# Patient Record
Sex: Female | Born: 1955 | Race: White | Hispanic: No | Marital: Married | Smoking: Former smoker
Health system: Southern US, Community
[De-identification: ages and names within clinical notes are randomized; demographics above are authoritative.]

## PROBLEM LIST (undated history)

## (undated) DIAGNOSIS — D649 Anemia, unspecified: Secondary | ICD-10-CM

## (undated) DIAGNOSIS — J45909 Unspecified asthma, uncomplicated: Secondary | ICD-10-CM

## (undated) DIAGNOSIS — F419 Anxiety disorder, unspecified: Secondary | ICD-10-CM

## (undated) DIAGNOSIS — G43909 Migraine, unspecified, not intractable, without status migrainosus: Secondary | ICD-10-CM

## (undated) DIAGNOSIS — D219 Benign neoplasm of connective and other soft tissue, unspecified: Secondary | ICD-10-CM

## (undated) DIAGNOSIS — R519 Headache, unspecified: Secondary | ICD-10-CM

## (undated) DIAGNOSIS — N649 Disorder of breast, unspecified: Secondary | ICD-10-CM

## (undated) DIAGNOSIS — IMO0002 Reserved for concepts with insufficient information to code with codable children: Secondary | ICD-10-CM

## (undated) HISTORY — PX: HX CYST REMOVAL: SHX22

## (undated) HISTORY — DX: Headache, unspecified: R51.9

## (undated) HISTORY — DX: Disorder of breast, unspecified: N64.9

## (undated) HISTORY — DX: Reserved for concepts with insufficient information to code with codable children: IMO0002

## (undated) HISTORY — PX: HX WISDOM TEETH EXTRACTION: SHX21

## (undated) HISTORY — PX: HX OTHER: 2100001105

## (undated) HISTORY — PX: HX HYSTERECTOMY: SHX81

## (undated) HISTORY — PX: HX BREAST BIOPSY: SHX20

## (undated) HISTORY — DX: Migraine, unspecified, not intractable, without status migrainosus: G43.909

## (undated) HISTORY — DX: Benign neoplasm of connective and other soft tissue, unspecified: D21.9

## (undated) HISTORY — DX: Anemia, unspecified: D64.9

## (undated) HISTORY — DX: Unspecified asthma, uncomplicated: J45.909

## (undated) HISTORY — PX: APPENDECTOMY: SHX54

## (undated) HISTORY — PX: BREAST EXCISIONAL BIOPSY: SUR124

## (undated) HISTORY — DX: Anxiety disorder, unspecified: F41.9

---

## 1988-03-10 HISTORY — PX: ABDOMINAL HYSTERECTOMY: SHX81

## 1992-03-10 HISTORY — PX: OOPHORECTOMY: SHX86

## 1993-03-10 HISTORY — PX: BREAST BIOPSY: SHX20

## 2008-11-15 ENCOUNTER — Ambulatory Visit (HOSPITAL_COMMUNITY): Payer: Self-pay | Admitting: Plastic and Reconstructive Surgery

## 2013-11-01 ENCOUNTER — Ambulatory Visit (INDEPENDENT_AMBULATORY_CARE_PROVIDER_SITE_OTHER): Payer: Managed Care, Other (non HMO) | Admitting: Obstetrics & Gynecology

## 2013-11-01 ENCOUNTER — Encounter (INDEPENDENT_AMBULATORY_CARE_PROVIDER_SITE_OTHER): Payer: Self-pay | Admitting: Obstetrics & Gynecology

## 2013-11-01 VITALS — BP 130/82 | Ht 63.0 in | Wt 126.0 lb

## 2013-11-01 DIAGNOSIS — Z1231 Encounter for screening mammogram for malignant neoplasm of breast: Secondary | ICD-10-CM

## 2013-11-01 DIAGNOSIS — Z01419 Encounter for gynecological examination (general) (routine) without abnormal findings: Secondary | ICD-10-CM

## 2013-11-01 NOTE — Progress Notes (Signed)
SUBJECTIVE:   58 y.o. female for annual routine Pap smear and checkup.  Current Outpatient Prescriptions   Medication Sig Dispense Refill    amitriptyline (ELAVIL) 100 mg Oral Tablet Take 100 mg by mouth Every night      atorvastatin (LIPITOR) 10 mg Oral Tablet Take 10 mg by mouth Once a day      CALCIUM CARBONATE/VITAMIN D3 (VITAMIN D-3 ORAL) Take by mouth      citalopram (CELEXA) 40 mg Oral Tablet Take 40 mg by mouth Once a day      cyanocobalamin (VITAMIN B 12) 1,000 mcg Oral Tablet Take 1,000 mcg by mouth Once a day       No current facility-administered medications for this visit.     Allergies: Review of patient's allergies indicates no known allergies.   No LMP recorded. Patient has had a hysterectomy.    ROS:  Feeling well. No dyspnea or chest pain on exertion.  No abdominal pain, change in bowel habits, black or bloody stools.  No urinary tract symptoms. GYN ROS: no breast pain or new or enlarging lumps on self exam, no vaginal bleeding, no hot flashes.    OBJECTIVE:   The patient appears well, in NAD.   BP 130/82 mmHg   Ht 1.6 m ( )   Wt 57.153 kg (126 lb)   BMI 22.33 kg/m2  ENT normal.  Neck supple. No adenopathy or thyromegaly. PERLA. Lungs are clear, good air entry, no wheezes, rhonchi or rales. S1 and S2 normal, no murmurs, regular rate and rhythm. No edema. Abdomen soft without tenderness, guarding, mass or organomegaly.    BREAST EXAM: normal without suspicious masses, skin or nipple changes or axillary nodes, self-exam is taught and encouraged    PELVIC EXAM: exam chaperoned by nurse, EGBUS within normal limits, normal vagina and vulva, atrophic vaginal changes noted, adnexa normal in size without mass or tenderness, uterus and adnexa surgically absent, no mass or tenderness, rectal exam, no lesions, normal sphincter tone, no masses, negative guaiac, pap smear done today    ASSESSMENT:   well woman  postmenopause .    PLAN:   mammogram  pap smear  counseled on breast self exam, mammography  screening, menopause, adequate intake of calcium and vitamin D, diet and exercise  return annually or prn

## 2013-11-01 NOTE — Patient Instructions (Signed)
Call if any problems, irregular bleeding, pelvic pain  Other instructions included in progress note

## 2013-11-03 ENCOUNTER — Other Ambulatory Visit (INDEPENDENT_AMBULATORY_CARE_PROVIDER_SITE_OTHER): Payer: Self-pay | Admitting: Obstetrics & Gynecology

## 2013-11-03 DIAGNOSIS — Z01419 Encounter for gynecological examination (general) (routine) without abnormal findings: Secondary | ICD-10-CM

## 2014-11-03 ENCOUNTER — Encounter (INDEPENDENT_AMBULATORY_CARE_PROVIDER_SITE_OTHER): Payer: Managed Care, Other (non HMO) | Admitting: Obstetrics & Gynecology

## 2014-12-20 ENCOUNTER — Encounter (INDEPENDENT_AMBULATORY_CARE_PROVIDER_SITE_OTHER): Payer: Self-pay | Admitting: Obstetrics & Gynecology

## 2014-12-20 ENCOUNTER — Ambulatory Visit (INDEPENDENT_AMBULATORY_CARE_PROVIDER_SITE_OTHER): Payer: BC Managed Care – PPO | Admitting: Obstetrics & Gynecology

## 2014-12-20 VITALS — BP 130/74 | Ht 63.0 in | Wt 125.0 lb

## 2014-12-20 DIAGNOSIS — Z01419 Encounter for gynecological examination (general) (routine) without abnormal findings: Principal | ICD-10-CM

## 2014-12-20 DIAGNOSIS — Z1231 Encounter for screening mammogram for malignant neoplasm of breast: Secondary | ICD-10-CM

## 2014-12-20 NOTE — Progress Notes (Signed)
[facility]  [address]  [address]  [place], [place] [id number]  Phone: [phone]  Fax: [phone]           Patient Name:  Beverly Keller  Date of Birth:  Nov 16, 1955  MRN#:  [id number]  Date of Service:  12/20/2014            SUBJECTIVE:   59 y.o. female for annual routine Pap smear and checkup.  Current Outpatient Prescriptions   Medication Sig Dispense Refill    amitriptyline (ELAVIL) 100 mg Oral Tablet Take 100 mg by mouth Every night      atorvastatin (LIPITOR) 10 mg Oral Tablet Take 10 mg by mouth Once a day      CALCIUM CARBONATE/VITAMIN D3 (VITAMIN D-3 ORAL) Take by mouth      citalopram (CELEXA) 40 mg Oral Tablet Take 40 mg by mouth Once a day      cyanocobalamin (VITAMIN B 12) 1,000 mcg Oral Tablet Take 1,000 mcg by mouth Once a day       No current facility-administered medications for this visit.     Allergies: Penicillins   No LMP recorded. Patient has had a hysterectomy.    ROS:  Feeling well. No dyspnea or chest pain on exertion.  No abdominal pain, change in bowel habits, black or bloody stools.  No urinary tract symptoms. GYN ROS: no breast pain or new or enlarging lumps on self exam, no vaginal bleeding, no hot flashes.    OBJECTIVE:   The patient appears well, in NAD.   BP 130/74 mmHg   Ht 1.6 m (5\' 3" )   Wt 56.7 kg (125 lb)   BMI 22.15 kg/m2  ENT normal.  Neck supple. No adenopathy or thyromegaly. PERLA. Lungs are clear, good air entry, no wheezes, rhonchi or rales. S1 and S2 normal, no murmurs, regular rate and rhythm. No edema. Abdomen soft without tenderness, guarding, mass or organomegaly.    BREAST EXAM: normal without suspicious masses, skin or nipple changes or axillary nodes, self-exam is taught and encouraged    PELVIC EXAM: exam chaperoned by nurse, EGBUS within normal limits, normal vagina and vulva, atrophic vaginal changes noted, uterus and adnexa surgically absent, no mass or tenderness, rectal exam, no lesions, normal  sphincter tone, no masses, negative guaiac, pap smear done today    ASSESSMENT:   well woman  postmenopause .    PLAN:   mammogram  pap smear  counseled on breast self exam, mammography screening, menopause, adequate intake of calcium and vitamin D, diet and exercise  return annually or prn

## 2014-12-20 NOTE — Patient Instructions (Signed)
Call if any problems, irregular bleeding, pelvic pain  Other instructions included in progress note

## 2014-12-25 ENCOUNTER — Other Ambulatory Visit (INDEPENDENT_AMBULATORY_CARE_PROVIDER_SITE_OTHER): Payer: Self-pay | Admitting: Obstetrics & Gynecology

## 2014-12-25 DIAGNOSIS — Z01419 Encounter for gynecological examination (general) (routine) without abnormal findings: Secondary | ICD-10-CM

## 2015-07-10 ENCOUNTER — Ambulatory Visit (INDEPENDENT_AMBULATORY_CARE_PROVIDER_SITE_OTHER): Payer: BLUE CROSS/BLUE SHIELD | Admitting: Internal Medicine

## 2015-07-10 ENCOUNTER — Encounter: Payer: Self-pay | Admitting: Internal Medicine

## 2015-07-10 VITALS — BP 134/80 | HR 85 | Temp 98.3°F | Resp 16 | Ht 63.0 in | Wt 128.0 lb

## 2015-07-10 DIAGNOSIS — F419 Anxiety disorder, unspecified: Secondary | ICD-10-CM

## 2015-07-10 DIAGNOSIS — G43109 Migraine with aura, not intractable, without status migrainosus: Secondary | ICD-10-CM | POA: Diagnosis not present

## 2015-07-10 DIAGNOSIS — E785 Hyperlipidemia, unspecified: Secondary | ICD-10-CM

## 2015-07-10 DIAGNOSIS — G43909 Migraine, unspecified, not intractable, without status migrainosus: Secondary | ICD-10-CM | POA: Insufficient documentation

## 2015-07-10 MED ORDER — CITALOPRAM HYDROBROMIDE 40 MG PO TABS: 40.0000 mg | ORAL_TABLET | Freq: Every day | ORAL | Status: DC

## 2015-07-10 MED ORDER — AMITRIPTYLINE HCL 100 MG PO TABS: 100.0000 mg | ORAL_TABLET | Freq: Every day | ORAL | Status: DC

## 2015-07-10 MED ORDER — SUMATRIPTAN SUCCINATE 25 MG PO TABS: 25.0000 mg | ORAL_TABLET | ORAL | Status: DC | PRN

## 2015-07-10 MED ORDER — ATORVASTATIN CALCIUM 10 MG PO TABS: 10.0000 mg | ORAL_TABLET | Freq: Every day | ORAL | Status: DC

## 2015-07-10 NOTE — Progress Notes (Signed)
Subjective:    Patient ID: Selena Torres, female    DOB: 17-May-1955, 60 y.o.   MRN: [id number]  HPI She is here to establish with a new pcp.    Hyperlipidemia: She is taking her medication daily. She is compliant with a low fat/cholesterol diet. She is very active, but not exercising regularly. She denies myalgias.   Migraines:  She typically gets oral with migraines. She takes amitriptyline nightly and this has decreased the frequency of her migraines. She takes Imitrex as needed and this is effective as long as she takes it early enough. She can get 3 migraines in one week and not have a migraines for weeks after or can get them more frequently.  She feels her migraines are currently controlled. He  Anxiety: She is taking her medication daily as prescribed. She denies any side effects from the medication. She feels her anxiety is well controlled and she is happy with her current dose of medication.     Medications and allergies reviewed with patient and updated if appropriate.  There are no active problems to display for this patient.   No current outpatient prescriptions on file prior to visit.   No current facility-administered medications on file prior to visit.    Past Medical History  Diagnosis Date  . Migraine     Past Surgical History  Procedure Laterality Date  . Breast biopsy  1995  . Appendectomy    . Abdominal hysterectomy    . Oophorectomy  1994    Social History   Social History  . Marital Status: Married    Spouse Name: N/A  . Number of Children: N/A  . Years of Education: N/A   Social History Main Topics  . Smoking status: Former Research scientist (life sciences)  . Smokeless tobacco: Never Used  . Alcohol Use: Yes  . Drug Use: No  . Sexual Activity: Not Asked   Other Topics Concern  . None   Social History Narrative  . None    Family History  Problem Relation Age of Onset  . Cancer Mother     Breast    Review of Systems  Constitutional: Negative for  fever, chills, appetite change and fatigue.  HENT: Positive for postnasal drip (allergies).   Eyes: Positive for visual disturbance (couple episodes of double vision).  Respiratory: Negative for cough, shortness of breath and wheezing.   Cardiovascular: Negative for chest pain, palpitations and leg swelling.  Gastrointestinal: Negative for nausea, abdominal pain, diarrhea, constipation and blood in stool.       Occasional gerd  Genitourinary: Negative for dysuria and hematuria.  Musculoskeletal: Negative for myalgias, back pain and arthralgias.  Neurological: Positive for headaches (migraines). Negative for dizziness and light-headedness.  Psychiatric/Behavioral: Negative for dysphoric mood. The patient is nervous/anxious (controlled).        Objective:   Filed Vitals:   07/10/15 0852  BP: 134/80  Pulse: 85  Temp: 98.3 F (36.8 C)  Resp: 16   Filed Weights   07/10/15 0852  Weight: 128 lb (58.06 kg)   Body mass index is 22.68 kg/(m^2).   Physical Exam Constitutional: She appears well-developed and well-nourished. No distress.  HENT:  Head: Normocephalic and atraumatic.  Right Ear: External ear normal. Normal ear canal and TM Left Ear: External ear normal.  Normal ear canal and TM Mouth/Throat: Oropharynx is clear and moist.  Neck: Neck supple. No tracheal deviation present. No thyromegaly present.  No carotid bruit  Cardiovascular: Normal rate, regular rhythm and  normal heart sounds.   No murmur heard.  No edema. Pulmonary/Chest: Effort normal and breath sounds normal. No respiratory distress. She has no wheezes. She has no rales.  Abdominal: Soft. She exhibits no distension. There is no tenderness.  Lymphadenopathy: She has no cervical adenopathy.  Skin: Skin is warm and dry. She is not diaphoretic.  Psychiatric: She has a normal mood and affect. Her behavior is normal.         Assessment & Plan:   Saw gyn last august   See Problem List for Assessment and Plan  of chronic medical problems.  Follow up annually

## 2015-07-10 NOTE — Assessment & Plan Note (Signed)
Taking lipitor 10 mg daily Check lipid panel, cmp

## 2015-07-10 NOTE — Assessment & Plan Note (Signed)
Controlled, stable Continue current dose of celexa 

## 2015-07-10 NOTE — Progress Notes (Signed)
Pre visit review using our clinic review tool, if applicable. No additional management support is needed unless otherwise documented below in the visit note. 

## 2015-07-10 NOTE — Patient Instructions (Signed)
  Test(s) ordered today. Your results will be released to MyChart (or called to you) after review, usually within 72hours after test completion. If any changes need to be made, you will be notified at that same time.  All other Health Maintenance issues reviewed.   All recommended immunizations and age-appropriate screenings are up-to-date or discussed.  No immunizations administered today.   Medications reviewed and updated.  No changes recommended at this time.  Your prescription(s) have been submitted to your pharmacy. Please take as directed and contact our office if you believe you are having problem(s) with the medication(s).   Please followup in one year   

## 2015-07-10 NOTE — Assessment & Plan Note (Signed)
Controlled, stable Continue amitriptyline nightly Continue Imitrex as needed.

## 2015-07-12 ENCOUNTER — Other Ambulatory Visit (INDEPENDENT_AMBULATORY_CARE_PROVIDER_SITE_OTHER): Payer: BLUE CROSS/BLUE SHIELD

## 2015-07-12 DIAGNOSIS — G43109 Migraine with aura, not intractable, without status migrainosus: Secondary | ICD-10-CM | POA: Diagnosis not present

## 2015-07-12 DIAGNOSIS — F419 Anxiety disorder, unspecified: Secondary | ICD-10-CM

## 2015-07-12 DIAGNOSIS — E785 Hyperlipidemia, unspecified: Secondary | ICD-10-CM | POA: Diagnosis not present

## 2015-07-12 LAB — CBC WITH DIFFERENTIAL/PLATELET
Basophils Absolute: 0.1 10*3/uL (ref 0.0–0.1)
Basophils Relative: 0.7 % (ref 0.0–3.0)
EOS PCT: 3.1 % (ref 0.0–5.0)
Eosinophils Absolute: 0.3 10*3/uL (ref 0.0–0.7)
HEMATOCRIT: 39.4 % (ref 36.0–46.0)
HEMOGLOBIN: 13.4 g/dL (ref 12.0–15.0)
Lymphocytes Relative: 32.4 % (ref 12.0–46.0)
Lymphs Abs: 3 10*3/uL (ref 0.7–4.0)
MCHC: 34.1 g/dL (ref 30.0–36.0)
MCV: 93.1 fl (ref 78.0–100.0)
MONOS PCT: 6.6 % (ref 3.0–12.0)
Monocytes Absolute: 0.6 10*3/uL (ref 0.1–1.0)
Neutro Abs: 5.2 10*3/uL (ref 1.4–7.7)
Neutrophils Relative %: 57.2 % (ref 43.0–77.0)
Platelets: 284 10*3/uL (ref 150.0–400.0)
RBC: 4.23 Mil/uL (ref 3.87–5.11)
RDW: 12.9 % (ref 11.5–15.5)
WBC: 9.1 10*3/uL (ref 4.0–10.5)

## 2015-07-13 LAB — COMPREHENSIVE METABOLIC PANEL
ALBUMIN: 4.6 g/dL (ref 3.5–5.2)
ALK PHOS: 66 U/L (ref 39–117)
ALT: 20 U/L (ref 0–35)
AST: 22 U/L (ref 0–37)
BUN: 14 mg/dL (ref 6–23)
CO2: 27 mEq/L (ref 19–32)
Calcium: 10 mg/dL (ref 8.4–10.5)
Chloride: 101 mEq/L (ref 96–112)
Creatinine, Ser: 0.88 mg/dL (ref 0.40–1.20)
GFR: 69.93 mL/min (ref >60.00)
Glucose, Bld: 104 mg/dL — ABNORMAL HIGH (ref 70–99)
POTASSIUM: 3.8 meq/L (ref 3.5–5.1)
Sodium: 138 mEq/L (ref 135–145)
TOTAL PROTEIN: 7.5 g/dL (ref 6.0–8.3)
Total Bilirubin: 0.5 mg/dL (ref 0.2–1.2)

## 2015-07-13 LAB — LIPID PANEL
CHOLESTEROL: 213 mg/dL — AB (ref 0–200)
HDL: 87.3 mg/dL (ref >39.00)
LDL Cholesterol: 109 mg/dL — ABNORMAL HIGH (ref 0–99)
NonHDL: 125.66
Total CHOL/HDL Ratio: 2
Triglycerides: 83 mg/dL (ref 0.0–149.0)
VLDL: 16.6 mg/dL (ref 0.0–40.0)

## 2015-07-13 LAB — TSH: TSH: 2.33 u[IU]/mL (ref 0.35–4.50)

## 2015-12-27 ENCOUNTER — Ambulatory Visit (INDEPENDENT_AMBULATORY_CARE_PROVIDER_SITE_OTHER): Payer: Self-pay | Admitting: Obstetrics & Gynecology

## 2016-08-12 ENCOUNTER — Ambulatory Visit (INDEPENDENT_AMBULATORY_CARE_PROVIDER_SITE_OTHER): Payer: BLUE CROSS/BLUE SHIELD | Admitting: Internal Medicine

## 2016-08-12 ENCOUNTER — Encounter: Payer: Self-pay | Admitting: Internal Medicine

## 2016-08-12 ENCOUNTER — Other Ambulatory Visit (INDEPENDENT_AMBULATORY_CARE_PROVIDER_SITE_OTHER): Payer: BLUE CROSS/BLUE SHIELD

## 2016-08-12 VITALS — BP 122/80 | HR 98 | Temp 98.3°F | Resp 16 | Ht 63.0 in | Wt 120.0 lb

## 2016-08-12 DIAGNOSIS — Z23 Encounter for immunization: Secondary | ICD-10-CM | POA: Diagnosis not present

## 2016-08-12 DIAGNOSIS — E2839 Other primary ovarian failure: Secondary | ICD-10-CM | POA: Diagnosis not present

## 2016-08-12 DIAGNOSIS — Z13820 Encounter for screening for osteoporosis: Secondary | ICD-10-CM

## 2016-08-12 DIAGNOSIS — Z114 Encounter for screening for human immunodeficiency virus [HIV]: Secondary | ICD-10-CM | POA: Diagnosis not present

## 2016-08-12 DIAGNOSIS — G43109 Migraine with aura, not intractable, without status migrainosus: Secondary | ICD-10-CM

## 2016-08-12 DIAGNOSIS — Z1159 Encounter for screening for other viral diseases: Secondary | ICD-10-CM

## 2016-08-12 DIAGNOSIS — E7800 Pure hypercholesterolemia, unspecified: Secondary | ICD-10-CM | POA: Diagnosis not present

## 2016-08-12 DIAGNOSIS — F419 Anxiety disorder, unspecified: Secondary | ICD-10-CM

## 2016-08-12 DIAGNOSIS — R739 Hyperglycemia, unspecified: Secondary | ICD-10-CM

## 2016-08-12 DIAGNOSIS — Z1231 Encounter for screening mammogram for malignant neoplasm of breast: Secondary | ICD-10-CM

## 2016-08-12 DIAGNOSIS — Z0000 Encounter for general adult medical examination without abnormal findings: Secondary | ICD-10-CM

## 2016-08-12 DIAGNOSIS — R7303 Prediabetes: Secondary | ICD-10-CM | POA: Insufficient documentation

## 2016-08-12 LAB — COMPREHENSIVE METABOLIC PANEL
ALBUMIN: 4.4 g/dL (ref 3.5–5.2)
ALT: 16 U/L (ref 0–35)
AST: 21 U/L (ref 0–37)
Alkaline Phosphatase: 79 U/L (ref 39–117)
BUN: 17 mg/dL (ref 6–23)
CALCIUM: 9.2 mg/dL (ref 8.4–10.5)
CO2: 27 meq/L (ref 19–32)
Chloride: 101 mEq/L (ref 96–112)
Creatinine, Ser: 0.98 mg/dL (ref 0.40–1.20)
GFR: 61.54 mL/min (ref >60.00)
Glucose, Bld: 99 mg/dL (ref 70–99)
POTASSIUM: 3.6 meq/L (ref 3.5–5.1)
Sodium: 137 mEq/L (ref 135–145)
Total Bilirubin: 0.4 mg/dL (ref 0.2–1.2)
Total Protein: 7.4 g/dL (ref 6.0–8.3)

## 2016-08-12 LAB — CBC WITH DIFFERENTIAL/PLATELET
BASOS PCT: 1.2 % (ref 0.0–3.0)
Basophils Absolute: 0.1 10*3/uL (ref 0.0–0.1)
EOS PCT: 3.1 % (ref 0.0–5.0)
Eosinophils Absolute: 0.4 10*3/uL (ref 0.0–0.7)
HEMATOCRIT: 39.2 % (ref 36.0–46.0)
HEMOGLOBIN: 13.2 g/dL (ref 12.0–15.0)
LYMPHS PCT: 30 % (ref 12.0–46.0)
Lymphs Abs: 3.7 10*3/uL (ref 0.7–4.0)
MCHC: 33.8 g/dL (ref 30.0–36.0)
MCV: 95.5 fl (ref 78.0–100.0)
Monocytes Absolute: 0.8 10*3/uL (ref 0.1–1.0)
Monocytes Relative: 6.5 % (ref 3.0–12.0)
NEUTROS ABS: 7.4 10*3/uL (ref 1.4–7.7)
Neutrophils Relative %: 59.2 % (ref 43.0–77.0)
PLATELETS: 344 10*3/uL (ref 150.0–400.0)
RBC: 4.1 Mil/uL (ref 3.87–5.11)
RDW: 13.3 % (ref 11.5–15.5)
WBC: 12.4 10*3/uL — AB (ref 4.0–10.5)

## 2016-08-12 LAB — LIPID PANEL
CHOLESTEROL: 183 mg/dL (ref 0–200)
HDL: 79.6 mg/dL (ref >39.00)
LDL Cholesterol: 76 mg/dL (ref 0–99)
NonHDL: 103.72
TRIGLYCERIDES: 140 mg/dL (ref 0.0–149.0)
Total CHOL/HDL Ratio: 2
VLDL: 28 mg/dL (ref 0.0–40.0)

## 2016-08-12 LAB — HEMOGLOBIN A1C: HEMOGLOBIN A1C: 5.8 % (ref 4.6–6.5)

## 2016-08-12 LAB — TSH: TSH: 2.16 u[IU]/mL (ref 0.35–4.50)

## 2016-08-12 NOTE — Assessment & Plan Note (Addendum)
Check lipid panel  Continue daily statin Regular exercise and healthy diet encouraged  

## 2016-08-12 NOTE — Assessment & Plan Note (Signed)
Will check a1c

## 2016-08-12 NOTE — Progress Notes (Signed)
Subjective:    Patient ID: Selena Torres, female    DOB: 06-Nov-1955, 61 y.o.   MRN: [id number]  HPI She is here for a physical exam.   She feels good and has no concerns.  She needs a referral for a mammogram.      Medications and allergies reviewed with patient and updated if appropriate.  Patient Active Problem List   Diagnosis Date Noted  . Hyperglycemia 08/12/2016  . Migraine 07/10/2015  . Hyperlipidemia 07/10/2015  . Anxiety 07/10/2015    Current Outpatient Prescriptions on File Prior to Visit  Medication Sig Dispense Refill  . amitriptyline (ELAVIL) 100 MG tablet Take 1 tablet (100 mg total) by mouth at bedtime. 90 tablet 3  . atorvastatin (LIPITOR) 10 MG tablet Take 1 tablet (10 mg total) by mouth daily. 90 tablet 3  . citalopram (CELEXA) 40 MG tablet Take 1 tablet (40 mg total) by mouth daily. 90 tablet 3  . SUMAtriptan (IMITREX) 25 MG tablet Take 1 tablet (25 mg total) by mouth every 2 (two) hours as needed for migraine. May repeat in 2 hours if headache persists or recurs. 10 tablet 11  . vitamin B-12 (CYANOCOBALAMIN) 1000 MCG tablet Take 1,000 mcg by mouth daily.     No current facility-administered medications on file prior to visit.     Past Medical History:  Diagnosis Date  . Migraine     Past Surgical History:  Procedure Laterality Date  . ABDOMINAL HYSTERECTOMY    . APPENDECTOMY    . BREAST BIOPSY  1995  . OOPHORECTOMY  1994    Social History   Social History  . Marital status: Married    Spouse name: N/A  . Number of children: N/A  . Years of education: N/A   Social History Main Topics  . Smoking status: Former Research scientist (life sciences)  . Smokeless tobacco: Never Used  . Alcohol use Yes  . Drug use: No  . Sexual activity: Not Asked   Other Topics Concern  . None   Social History Narrative   Exercise: walks a lot, very active, no regimented exercise    Family History  Problem Relation Age of Onset  . Breast cancer Mother   . Dementia Mother   .  Alzheimer's disease Father     Review of Systems  Constitutional: Positive for diaphoresis (hot flashes). Negative for chills, fatigue and fever.  Eyes: Negative for visual disturbance.  Respiratory: Negative for cough, shortness of breath and wheezing.   Cardiovascular: Positive for palpitations. Negative for chest pain and leg swelling.  Gastrointestinal: Negative for abdominal pain, blood in stool, constipation, diarrhea and nausea.       No gerd  Genitourinary: Negative for dysuria and hematuria.  Musculoskeletal: Negative for arthralgias and back pain.  Skin: Negative for color change and rash.  Neurological: Positive for light-headedness (rare when standing) and headaches (migraines).  Psychiatric/Behavioral: Positive for dysphoric mood (at times, mild). The patient is nervous/anxious (controlled).        Objective:   Vitals:   08/12/16 1506  BP: 122/80  Pulse: 98  Resp: 16  Temp: 98.3 F (36.8 C)   Filed Weights   08/12/16 1506  Weight: 120 lb (54.4 kg)   Body mass index is 21.26 kg/m.  Wt Readings from Last 3 Encounters:  08/12/16 120 lb (54.4 kg)  07/10/15 128 lb (58.1 kg)     Physical Exam Constitutional: She appears well-developed and well-nourished. No distress.  HENT:  Head: Normocephalic and atraumatic.  Right Ear: External ear normal. Normal ear canal and TM Left Ear: External ear normal.  Normal ear canal and TM Mouth/Throat: Oropharynx is clear and moist.  Eyes: Conjunctivae and EOM are normal.  Neck: Neck supple. No tracheal deviation present. No thyromegaly present.  No carotid bruit  Cardiovascular: Normal rate, regular rhythm and normal heart sounds.   No murmur heard.  No edema. Pulmonary/Chest: Effort normal and breath sounds normal. No respiratory distress. She has no wheezes. She has no rales.  Breast: deferred to Gyn Abdominal: Soft. She exhibits no distension. There is no tenderness.  Lymphadenopathy: She has no cervical adenopathy.    Skin: Skin is warm and dry. She is not diaphoretic.  Psychiatric: She has a normal mood and affect. Her behavior is normal.         Assessment & Plan:   Physical exam: Screening blood work ordered Immunizations  tdap today Colonoscopy - done ? 7-8 years ago in [place] - she will check Mammogram  Referral ordered Gyn  - needs to establish - recommended a gyn Dexa  Will order dexa Eye exams  Due - will schedule Exercise  She walks for exercise,  Does weight occasionally Weight  Normal BMI Skin no concerns Substance abuse  none  See Problem List for Assessment and Plan of chronic medical problems.   FU annually

## 2016-08-12 NOTE — Assessment & Plan Note (Signed)
Controlled, stable Continue current dose of medication  

## 2016-08-12 NOTE — Patient Instructions (Addendum)
[facility] [address]  [place]  [place], [place] [address]  Main: [phone]    Test(s) ordered today. Your results will be released to [facility] (or called to you) after review, usually within 72hours after test completion. If any changes need to be made, you will be notified at that same time.  All other Health Maintenance issues reviewed.   All recommended immunizations and age-appropriate screenings are up-to-date or discussed.  Tetanus immunization administered today.   Medications reviewed and updated.   No changes recommended at this time.  Your prescription(s) have been submitted to your pharmacy. Please take as directed and contact our office if you believe you are having problem(s) with the medication(s).  A referral was ordered for a mammogram.  A dexa was ordered.  Please followup in one year for a physical   Health Maintenance, Female Adopting a healthy lifestyle and getting preventive care can go a long way to promote health and wellness. Talk with your health care provider about what schedule of regular examinations is right for you. This is a good chance for you to check in with your provider about disease prevention and staying healthy. In between checkups, there are plenty of things you can do on your own. Experts have done a lot of research about which lifestyle changes and preventive measures are most likely to keep you healthy. Ask your health care provider for more information. Weight and diet Eat a healthy diet  Be sure to include plenty of vegetables, fruits, low-fat dairy products, and lean protein.  Do not eat a lot of foods high in solid fats, added sugars, or salt.  Get regular exercise. This is one of the most important things you can do for your health. ? Most adults should exercise for at least 150 minutes each week. The exercise should increase your heart rate and make you sweat (moderate-intensity  exercise). ? Most adults should also do strengthening exercises at least twice a week. This is in addition to the moderate-intensity exercise.  Maintain a healthy weight  Body mass index (BMI) is a measurement that can be used to identify possible weight problems. It estimates body fat based on height and weight. Your health care provider can help determine your BMI and help you achieve or maintain a healthy weight.  For females 60 years of age and older: ? A BMI below 18.5 is considered underweight. ? A BMI of 18.5 to 24.9 is normal. ? A BMI of 25 to 29.9 is considered overweight. ? A BMI of 30 and above is considered obese.  Watch levels of cholesterol and blood lipids  You should start having your blood tested for lipids and cholesterol at 60 years of age, then have this test every 5 years.  You may need to have your cholesterol levels checked more often if: ? Your lipid or cholesterol levels are high. ? You are older than 60 years of age. ? You are at high risk for heart disease.  Cancer screening Lung Cancer  Lung cancer screening is recommended for adults 77-51 years old who are at high risk for lung cancer because of a history of smoking.  A yearly low-dose CT scan of the lungs is recommended for people who: ? Currently smoke. ? Have quit within the past 15 years. ? Have at least a 30-pack-year history of smoking. A pack year is smoking an average of one pack of cigarettes a day for 1 year.  Yearly screening should continue until it has been 15 years since you quit.  Yearly screening should stop if you develop a health problem that would prevent you from having lung cancer treatment.  Breast Cancer  Practice breast self-awareness. This means understanding how your breasts normally appear and feel.  It also means doing regular breast self-exams. Let your health care provider know about any changes, no matter how small.  If you are in your 20s or 30s, you should have a  clinical breast exam (CBE) by a health care provider every 1-3 years as part of a regular health exam.  If you are 35 or older, have a CBE every year. Also consider having a breast X-ray (mammogram) every year.  If you have a family history of breast cancer, talk to your health care provider about genetic screening.  If you are at high risk for breast cancer, talk to your health care provider about having an MRI and a mammogram every year.  Breast cancer gene (BRCA) assessment is recommended for women who have family members with BRCA-related cancers. BRCA-related cancers include: ? Breast. ? Ovarian. ? Tubal. ? Peritoneal cancers.  Results of the assessment will determine the need for genetic counseling and BRCA1 and BRCA2 testing.  Cervical Cancer Your health care provider may recommend that you be screened regularly for cancer of the pelvic organs (ovaries, uterus, and vagina). This screening involves a pelvic examination, including checking for microscopic changes to the surface of your cervix (Pap test). You may be encouraged to have this screening done every 3 years, beginning at age 36.  For women ages 61-65, health care providers may recommend pelvic exams and Pap testing every 3 years, or they may recommend the Pap and pelvic exam, combined with testing for human papilloma virus (HPV), every 5 years. Some types of HPV increase your risk of cervical cancer. Testing for HPV may also be done on women of any age with unclear Pap test results.  Other health care providers may not recommend any screening for nonpregnant women who are considered low risk for pelvic cancer and who do not have symptoms. Ask your health care provider if a screening pelvic exam is right for you.  If you have had past treatment for cervical cancer or a condition that could lead to cancer, you need Pap tests and screening for cancer for at least 20 years after your treatment. If Pap tests have been discontinued,  your risk factors (such as having a new sexual partner) need to be reassessed to determine if screening should resume. Some women have medical problems that increase the chance of getting cervical cancer. In these cases, your health care provider may recommend more frequent screening and Pap tests.  Colorectal Cancer  This type of cancer can be detected and often prevented.  Routine colorectal cancer screening usually begins at 60 years of age and continues through 60 years of age.  Your health care provider may recommend screening at an earlier age if you have risk factors for colon cancer.  Your health care provider may also recommend using home test kits to check for hidden blood in the stool.  A small camera at the end of a tube can be used to examine your colon directly (sigmoidoscopy or colonoscopy). This is done to check for the earliest forms of colorectal cancer.  Routine screening usually begins at age 35.  Direct examination of the colon should be repeated every 5-10 years through 60 years of age.  However, you may need to be screened more often if early forms of precancerous polyps or small growths are found.  Skin Cancer  Check your skin from head to toe regularly.  Tell your health care provider about any new moles or changes in moles, especially if there is a change in a mole's shape or color.  Also tell your health care provider if you have a mole that is larger than the size of a pencil eraser.  Always use sunscreen. Apply sunscreen liberally and repeatedly throughout the day.  Protect yourself by wearing long sleeves, pants, a wide-brimmed hat, and sunglasses whenever you are outside.  Heart disease, diabetes, and high blood pressure  High blood pressure causes heart disease and increases the risk of stroke. High blood pressure is more likely to develop in: ? People who have blood pressure in the high end of the normal range (130-139/85-89 mm Hg). ? People who are  overweight or obese. ? People who are African American.  If you are 21-51 years of age, have your blood pressure checked every 3-5 years. If you are 89 years of age or older, have your blood pressure checked every year. You should have your blood pressure measured twice-once when you are at a hospital or clinic, and once when you are not at a hospital or clinic. Record the average of the two measurements. To check your blood pressure when you are not at a hospital or clinic, you can use: ? An automated blood pressure machine at a pharmacy. ? A home blood pressure monitor.  If you are between 81 years and 71 years old, ask your health care provider if you should take aspirin to prevent strokes.  Have regular diabetes screenings. This involves taking a blood sample to check your fasting blood sugar level. ? If you are at a normal weight and have a low risk for diabetes, have this test once every three years after 60 years of age. ? If you are overweight and have a high risk for diabetes, consider being tested at a younger age or more often. Preventing infection Hepatitis B  If you have a higher risk for hepatitis B, you should be screened for this virus. You are considered at high risk for hepatitis B if: ? You were born in a country where hepatitis B is common. Ask your health care provider which countries are considered high risk. ? Your parents were born in a high-risk country, and you have not been immunized against hepatitis B (hepatitis B vaccine). ? You have HIV or AIDS. ? You use needles to inject street drugs. ? You live with someone who has hepatitis B. ? You have had sex with someone who has hepatitis B. ? You get hemodialysis treatment. ? You take certain medicines for conditions, including cancer, organ transplantation, and autoimmune conditions.  Hepatitis C  Blood testing is recommended for: ? Everyone born from [birth year] through 1964. ? Anyone with known risk factors for  hepatitis C.  Sexually transmitted infections (STIs)  You should be screened for sexually transmitted infections (STIs) including gonorrhea and chlamydia if: ? You are sexually active and are younger than 60 years of age. ? You are older than 60 years of age and your health care provider tells you that you are at risk for this type of infection. ? Your sexual activity has changed since you were last screened and you are at an increased risk for chlamydia or gonorrhea. Ask your health care provider if  you are at risk.  If you do not have HIV, but are at risk, it may be recommended that you take a prescription medicine daily to prevent HIV infection. This is called pre-exposure prophylaxis (PrEP). You are considered at risk if: ? You are sexually active and do not regularly use condoms or know the HIV status of your partner(s). ? You take drugs by injection. ? You are sexually active with a partner who has HIV.  Talk with your health care provider about whether you are at high risk of being infected with HIV. If you choose to begin PrEP, you should first be tested for HIV. You should then be tested every 3 months for as long as you are taking PrEP. Pregnancy  If you are premenopausal and you may become pregnant, ask your health care provider about preconception counseling.  If you may become pregnant, take 400 to 800 micrograms (mcg) of folic acid every day.  If you want to prevent pregnancy, talk to your health care provider about birth control (contraception). Osteoporosis and menopause  Osteoporosis is a disease in which the bones lose minerals and strength with aging. This can result in serious bone fractures. Your risk for osteoporosis can be identified using a bone density scan.  If you are 16 years of age or older, or if you are at risk for osteoporosis and fractures, ask your health care provider if you should be screened.  Ask your health care provider whether you should take a  calcium or vitamin D supplement to lower your risk for osteoporosis.  Menopause may have certain physical symptoms and risks.  Hormone replacement therapy may reduce some of these symptoms and risks. Talk to your health care provider about whether hormone replacement therapy is right for you. Follow these instructions at home:  Schedule regular health, dental, and eye exams.  Stay current with your immunizations.  Do not use any tobacco products including cigarettes, chewing tobacco, or electronic cigarettes.  If you are pregnant, do not drink alcohol.  If you are breastfeeding, limit how much and how often you drink alcohol.  Limit alcohol intake to no more than 1 drink per day for nonpregnant women. One drink equals 12 ounces of beer, 5 ounces of wine, or 1 ounces of hard liquor.  Do not use street drugs.  Do not share needles.  Ask your health care provider for help if you need support or information about quitting drugs.  Tell your health care provider if you often feel depressed.  Tell your health care provider if you have ever been abused or do not feel safe at home. This information is not intended to replace advice given to you by your health care provider. Make sure you discuss any questions you have with your health care provider. Document Released: 09/09/2010 Document Revised: 08/02/2015 Document Reviewed: 11/28/2014 Elsevier Interactive Patient Education  [employer].

## 2016-08-12 NOTE — Assessment & Plan Note (Signed)
Migraines better the past year - less often Taking elavil, imitrex as needed

## 2016-08-13 LAB — HIV ANTIBODY (ROUTINE TESTING W REFLEX): HIV: NONREACTIVE

## 2016-08-13 LAB — HEPATITIS C ANTIBODY: HCV AB: NEGATIVE

## 2016-08-14 ENCOUNTER — Encounter: Payer: Self-pay | Admitting: Internal Medicine

## 2016-08-17 ENCOUNTER — Encounter: Payer: Self-pay | Admitting: Internal Medicine

## 2016-08-18 MED ORDER — ATORVASTATIN CALCIUM 10 MG PO TABS: 10.0000 mg | ORAL_TABLET | Freq: Every day | ORAL | 3 refills | Status: DC

## 2016-08-18 MED ORDER — AMITRIPTYLINE HCL 100 MG PO TABS: 100.0000 mg | ORAL_TABLET | Freq: Every day | ORAL | 3 refills | Status: DC

## 2016-08-18 MED ORDER — CITALOPRAM HYDROBROMIDE 40 MG PO TABS: 40.0000 mg | ORAL_TABLET | Freq: Every day | ORAL | 3 refills | Status: DC

## 2016-08-18 MED ORDER — SUMATRIPTAN SUCCINATE 25 MG PO TABS: 25.0000 mg | ORAL_TABLET | ORAL | 11 refills | Status: DC | PRN

## 2016-11-07 ENCOUNTER — Other Ambulatory Visit: Payer: Self-pay | Admitting: Internal Medicine

## 2016-11-07 DIAGNOSIS — Z1231 Encounter for screening mammogram for malignant neoplasm of breast: Secondary | ICD-10-CM

## 2016-12-09 ENCOUNTER — Encounter: Payer: Self-pay | Admitting: Internal Medicine

## 2016-12-09 ENCOUNTER — Ambulatory Visit
Admission: RE | Admit: 2016-12-09 | Discharge: 2016-12-09 | Disposition: A | Discharge status: Home / Self Care | Payer: BLUE CROSS/BLUE SHIELD | Source: Ambulatory Visit | Attending: Internal Medicine | Admitting: Internal Medicine

## 2016-12-09 DIAGNOSIS — E2839 Other primary ovarian failure: Secondary | ICD-10-CM

## 2016-12-09 DIAGNOSIS — Z1231 Encounter for screening mammogram for malignant neoplasm of breast: Secondary | ICD-10-CM

## 2017-08-12 NOTE — Progress Notes (Signed)
Subjective:    Patient ID: Selena Torres, female    DOB: 03-20-1955, 62 y.o.   MRN: [id number]  HPI She is here for a physical exam.     Medications and allergies reviewed with patient and updated if appropriate.  Patient Active Problem List   Diagnosis Date Noted  . Hyperglycemia 08/12/2016  . Migraine 07/10/2015  . Hyperlipidemia 07/10/2015  . Anxiety 07/10/2015    Current Outpatient Medications on File Prior to Visit  Medication Sig Dispense Refill  . amitriptyline (ELAVIL) 100 MG tablet Take 1 tablet (100 mg total) by mouth at bedtime. 90 tablet 3  . atorvastatin (LIPITOR) 10 MG tablet Take 1 tablet (10 mg total) by mouth daily. 90 tablet 3  . citalopram (CELEXA) 40 MG tablet Take 1 tablet (40 mg total) by mouth daily. 90 tablet 3  . SUMAtriptan (IMITREX) 25 MG tablet Take 1 tablet (25 mg total) by mouth every 2 (two) hours as needed for migraine. May repeat in 2 hours if headache persists or recurs. 10 tablet 11  . vitamin B-12 (CYANOCOBALAMIN) 1000 MCG tablet Take 1,000 mcg by mouth daily.     No current facility-administered medications on file prior to visit.     Past Medical History:  Diagnosis Date  . Migraine     Past Surgical History:  Procedure Laterality Date  . ABDOMINAL HYSTERECTOMY    . APPENDECTOMY    . BREAST BIOPSY Left 1995  . BREAST EXCISIONAL BIOPSY Left 15+ yrs ago   benign  . OOPHORECTOMY  1994    Social History   Socioeconomic History  . Marital status: Married    Spouse name: Not on file  . Number of children: Not on file  . Years of education: Not on file  . Highest education level: Not on file  Occupational History  . Not on file  Social Needs  . Financial resource strain: Not on file  . Food insecurity:    Worry: Not on file    Inability: Not on file  . Transportation needs:    Medical: Not on file    Non-medical: Not on file  Tobacco Use  . Smoking status: Former Research scientist (life sciences)  . Smokeless tobacco: Never Used  Substance and  Sexual Activity  . Alcohol use: Yes  . Drug use: No  . Sexual activity: Not on file  Lifestyle  . Physical activity:    Days per week: Not on file    Minutes per session: Not on file  . Stress: Not on file  Relationships  . Social connections:    Talks on phone: Not on file    Gets together: Not on file    Attends religious service: Not on file    Active member of club or organization: Not on file    Attends meetings of clubs or organizations: Not on file    Relationship status: Not on file  Other Topics Concern  . Not on file  Social History Narrative   Exercise: walks a lot, very active, no regimented exercise    Family History  Problem Relation Age of Onset  . Breast cancer Mother   . Dementia Mother   . Alzheimer's disease Father     Review of Systems  Constitutional: Negative for chills and fever.  Eyes: Negative for visual disturbance.  Respiratory: Positive for cough (allergy related). Negative for shortness of breath and wheezing.   Cardiovascular: Positive for palpitations (daily, transient). Negative for chest pain and leg swelling.  Gastrointestinal: Positive for constipation (only occasional). Negative for abdominal pain, blood in stool, diarrhea and nausea.       Rare gerd  Genitourinary: Negative for dysuria and hematuria.  Musculoskeletal: Positive for arthralgias (very mild). Negative for back pain.  Skin: Negative for color change and rash.  Neurological: Positive for dizziness (transient, occ) and headaches (rare migraines).  Psychiatric/Behavioral: Negative for dysphoric mood and sleep disturbance. The patient is nervous/anxious (controlled).        Objective:   Vitals:   08/13/17 0905  BP: 124/78  Pulse: 92  Resp: 16  Temp: 98.5 F (36.9 C)  SpO2: 98%   Filed Weights   08/13/17 0905  Weight: 121 lb (54.9 kg)   Body mass index is 21.43 kg/m.  Wt Readings from Last 3 Encounters:  08/13/17 121 lb (54.9 kg)  08/12/16 120 lb (54.4 kg)    07/10/15 128 lb (58.1 kg)     Physical Exam Constitutional: She appears well-developed and well-nourished. No distress.  HENT:  Head: Normocephalic and atraumatic.  Right Ear: External ear normal. Normal ear canal and TM Left Ear: External ear normal.  Normal ear canal and TM Mouth/Throat: Oropharynx is clear and moist.  Eyes: Conjunctivae and EOM are normal.  Neck: Neck supple. No tracheal deviation present. No thyromegaly present.  No carotid bruit  Cardiovascular: Normal rate, regular rhythm and normal heart sounds.   No murmur heard.  No edema. Pulmonary/Chest: Effort normal and breath sounds normal. No respiratory distress. She has no wheezes. She has no rales.  Breast: deferred to Gyn Abdominal: Soft. She exhibits no distension. There is no tenderness.  Lymphadenopathy: She has no cervical adenopathy.  Skin: Skin is warm and dry. She is not diaphoretic.  Psychiatric: She has a normal mood and affect. Her behavior is normal.        Assessment & Plan:   Physical exam: Screening blood work ordered Immunizations    Discussed shingrix, others up to date Colonoscopy   ? last done - done in [place] - done at 19 - will refer  Mammogram   Up to date  Gyn  - due -  Will schedule Dexa     Up to date  Eye exams    Due - will schedule  EKG  No EKG on file Exercise      Walks for exercise, very active Weight   Normal BMI Skin        No concerns Substance abuse     none  See Problem List for Assessment and Plan of chronic medical problems.   FU in one year

## 2017-08-12 NOTE — Patient Instructions (Addendum)
[facility]  [address]  [place], [place] [address]  Main: [phone]    Test(s) ordered today. Your results will be released to [facility] (or called to you) after review, usually within 72hours after test completion. If any changes need to be made, you will be notified at that same time.  All other Health Maintenance issues reviewed.   All recommended immunizations and age-appropriate screenings are up-to-date or discussed.  No immunizations administered today.   Medications reviewed and updated.  No changes recommended at this time.  Your prescription(s) have been submitted to your pharmacy. Please take as directed and contact our office if you believe you are having problem(s) with the medication(s).  A referral was ordered for GI for a colonoscopy.  Please followup in one year   Health Maintenance, Female Adopting a healthy lifestyle and getting preventive care can go a long way to promote health and wellness. Talk with your health care provider about what schedule of regular examinations is right for you. This is a good chance for you to check in with your provider about disease prevention and staying healthy. In between checkups, there are plenty of things you can do on your own. Experts have done a lot of research about which lifestyle changes and preventive measures are most likely to keep you healthy. Ask your health care provider for more information. Weight and diet Eat a healthy diet  Be sure to include plenty of vegetables, fruits, low-fat dairy products, and lean protein.  Do not eat a lot of foods high in solid fats, added sugars, or salt.  Get regular exercise. This is one of the most important things you can do for your health. ? Most adults should exercise for at least 150 minutes each week. The exercise should increase your heart rate and make you sweat (moderate-intensity exercise). ? Most adults should also do strengthening  exercises at least twice a week. This is in addition to the moderate-intensity exercise.  Maintain a healthy weight  Body mass index (BMI) is a measurement that can be used to identify possible weight problems. It estimates body fat based on height and weight. Your health care provider can help determine your BMI and help you achieve or maintain a healthy weight.  For females 42 years of age and older: ? A BMI below 18.5 is considered underweight. ? A BMI of 18.5 to 24.9 is normal. ? A BMI of 25 to 29.9 is considered overweight. ? A BMI of 30 and above is considered obese.  Watch levels of cholesterol and blood lipids  You should start having your blood tested for lipids and cholesterol at 61 years of age, then have this test every 5 years.  You may need to have your cholesterol levels checked more often if: ? Your lipid or cholesterol levels are high. ? You are older than 61 years of age. ? You are at high risk for heart disease.  Cancer screening Lung Cancer  Lung cancer screening is recommended for adults 49-45 years old who are at high risk for lung cancer because of a history of smoking.  A yearly low-dose CT scan of the lungs is recommended for people who: ? Currently smoke. ? Have quit within the past 15 years. ? Have at least a 30-pack-year history of smoking. A pack year is smoking an average of one pack of cigarettes a day for 1 year.  Yearly screening should continue until it has been 15  years since you quit.  Yearly screening should stop if you develop a health problem that would prevent you from having lung cancer treatment.  Breast Cancer  Practice breast self-awareness. This means understanding how your breasts normally appear and feel.  It also means doing regular breast self-exams. Let your health care provider know about any changes, no matter how small.  If you are in your 20s or 30s, you should have a clinical breast exam (CBE) by a health care provider  every 1-3 years as part of a regular health exam.  If you are 75 or older, have a CBE every year. Also consider having a breast X-ray (mammogram) every year.  If you have a family history of breast cancer, talk to your health care provider about genetic screening.  If you are at high risk for breast cancer, talk to your health care provider about having an MRI and a mammogram every year.  Breast cancer gene (BRCA) assessment is recommended for women who have family members with BRCA-related cancers. BRCA-related cancers include: ? Breast. ? Ovarian. ? Tubal. ? Peritoneal cancers.  Results of the assessment will determine the need for genetic counseling and BRCA1 and BRCA2 testing.  Cervical Cancer Your health care provider may recommend that you be screened regularly for cancer of the pelvic organs (ovaries, uterus, and vagina). This screening involves a pelvic examination, including checking for microscopic changes to the surface of your cervix (Pap test). You may be encouraged to have this screening done every 3 years, beginning at age 73.  For women ages 68-65, health care providers may recommend pelvic exams and Pap testing every 3 years, or they may recommend the Pap and pelvic exam, combined with testing for human papilloma virus (HPV), every 5 years. Some types of HPV increase your risk of cervical cancer. Testing for HPV may also be done on women of any age with unclear Pap test results.  Other health care providers may not recommend any screening for nonpregnant women who are considered low risk for pelvic cancer and who do not have symptoms. Ask your health care provider if a screening pelvic exam is right for you.  If you have had past treatment for cervical cancer or a condition that could lead to cancer, you need Pap tests and screening for cancer for at least 20 years after your treatment. If Pap tests have been discontinued, your risk factors (such as having a new sexual  partner) need to be reassessed to determine if screening should resume. Some women have medical problems that increase the chance of getting cervical cancer. In these cases, your health care provider may recommend more frequent screening and Pap tests.  Colorectal Cancer  This type of cancer can be detected and often prevented.  Routine colorectal cancer screening usually begins at 61 years of age and continues through 61 years of age.  Your health care provider may recommend screening at an earlier age if you have risk factors for colon cancer.  Your health care provider may also recommend using home test kits to check for hidden blood in the stool.  A small camera at the end of a tube can be used to examine your colon directly (sigmoidoscopy or colonoscopy). This is done to check for the earliest forms of colorectal cancer.  Routine screening usually begins at age 52.  Direct examination of the colon should be repeated every 5-10 years through 61 years of age. However, you may need to be screened more often  if early forms of precancerous polyps or small growths are found.  Skin Cancer  Check your skin from head to toe regularly.  Tell your health care provider about any new moles or changes in moles, especially if there is a change in a mole's shape or color.  Also tell your health care provider if you have a mole that is larger than the size of a pencil eraser.  Always use sunscreen. Apply sunscreen liberally and repeatedly throughout the day.  Protect yourself by wearing long sleeves, pants, a wide-brimmed hat, and sunglasses whenever you are outside.  Heart disease, diabetes, and high blood pressure  High blood pressure causes heart disease and increases the risk of stroke. High blood pressure is more likely to develop in: ? People who have blood pressure in the high end of the normal range (130-139/85-89 mm Hg). ? People who are overweight or obese. ? People who are African  American.  If you are 25-68 years of age, have your blood pressure checked every 3-5 years. If you are 57 years of age or older, have your blood pressure checked every year. You should have your blood pressure measured twice-once when you are at a hospital or clinic, and once when you are not at a hospital or clinic. Record the average of the two measurements. To check your blood pressure when you are not at a hospital or clinic, you can use: ? An automated blood pressure machine at a pharmacy. ? A home blood pressure monitor.  If you are between 61 years and 54 years old, ask your health care provider if you should take aspirin to prevent strokes.  Have regular diabetes screenings. This involves taking a blood sample to check your fasting blood sugar level. ? If you are at a normal weight and have a low risk for diabetes, have this test once every three years after 61 years of age. ? If you are overweight and have a high risk for diabetes, consider being tested at a younger age or more often. Preventing infection Hepatitis B  If you have a higher risk for hepatitis B, you should be screened for this virus. You are considered at high risk for hepatitis B if: ? You were born in a country where hepatitis B is common. Ask your health care provider which countries are considered high risk. ? Your parents were born in a high-risk country, and you have not been immunized against hepatitis B (hepatitis B vaccine). ? You have HIV or AIDS. ? You use needles to inject street drugs. ? You live with someone who has hepatitis B. ? You have had sex with someone who has hepatitis B. ? You get hemodialysis treatment. ? You take certain medicines for conditions, including cancer, organ transplantation, and autoimmune conditions.  Hepatitis C  Blood testing is recommended for: ? Everyone born from [birth year] through 1964. ? Anyone with known risk factors for hepatitis C.  Sexually transmitted infections  (STIs)  You should be screened for sexually transmitted infections (STIs) including gonorrhea and chlamydia if: ? You are sexually active and are younger than 61 years of age. ? You are older than 61 years of age and your health care provider tells you that you are at risk for this type of infection. ? Your sexual activity has changed since you were last screened and you are at an increased risk for chlamydia or gonorrhea. Ask your health care provider if you are at risk.  If you do not  have HIV, but are at risk, it may be recommended that you take a prescription medicine daily to prevent HIV infection. This is called pre-exposure prophylaxis (PrEP). You are considered at risk if: ? You are sexually active and do not regularly use condoms or know the HIV status of your partner(s). ? You take drugs by injection. ? You are sexually active with a partner who has HIV.  Talk with your health care provider about whether you are at high risk of being infected with HIV. If you choose to begin PrEP, you should first be tested for HIV. You should then be tested every 3 months for as long as you are taking PrEP. Pregnancy  If you are premenopausal and you may become pregnant, ask your health care provider about preconception counseling.  If you may become pregnant, take 400 to 800 micrograms (mcg) of folic acid every day.  If you want to prevent pregnancy, talk to your health care provider about birth control (contraception). Osteoporosis and menopause  Osteoporosis is a disease in which the bones lose minerals and strength with aging. This can result in serious bone fractures. Your risk for osteoporosis can be identified using a bone density scan.  If you are 51 years of age or older, or if you are at risk for osteoporosis and fractures, ask your health care provider if you should be screened.  Ask your health care provider whether you should take a calcium or vitamin D supplement to lower your risk  for osteoporosis.  Menopause may have certain physical symptoms and risks.  Hormone replacement therapy may reduce some of these symptoms and risks. Talk to your health care provider about whether hormone replacement therapy is right for you. Follow these instructions at home:  Schedule regular health, dental, and eye exams.  Stay current with your immunizations.  Do not use any tobacco products including cigarettes, chewing tobacco, or electronic cigarettes.  If you are pregnant, do not drink alcohol.  If you are breastfeeding, limit how much and how often you drink alcohol.  Limit alcohol intake to no more than 1 drink per day for nonpregnant women. One drink equals 12 ounces of beer, 5 ounces of wine, or 1 ounces of hard liquor.  Do not use street drugs.  Do not share needles.  Ask your health care provider for help if you need support or information about quitting drugs.  Tell your health care provider if you often feel depressed.  Tell your health care provider if you have ever been abused or do not feel safe at home. This information is not intended to replace advice given to you by your health care provider. Make sure you discuss any questions you have with your health care provider. Document Released: 09/09/2010 Document Revised: 08/02/2015 Document Reviewed: 11/28/2014 Elsevier Interactive Patient Education  [employer].

## 2017-08-13 ENCOUNTER — Other Ambulatory Visit (INDEPENDENT_AMBULATORY_CARE_PROVIDER_SITE_OTHER): Payer: Self-pay

## 2017-08-13 ENCOUNTER — Encounter: Payer: Self-pay | Admitting: Internal Medicine

## 2017-08-13 ENCOUNTER — Ambulatory Visit (INDEPENDENT_AMBULATORY_CARE_PROVIDER_SITE_OTHER): Payer: Self-pay | Admitting: Internal Medicine

## 2017-08-13 VITALS — BP 124/78 | HR 92 | Temp 98.5°F | Resp 16 | Ht 63.0 in | Wt 121.0 lb

## 2017-08-13 DIAGNOSIS — E782 Mixed hyperlipidemia: Secondary | ICD-10-CM

## 2017-08-13 DIAGNOSIS — F419 Anxiety disorder, unspecified: Secondary | ICD-10-CM

## 2017-08-13 DIAGNOSIS — R739 Hyperglycemia, unspecified: Secondary | ICD-10-CM

## 2017-08-13 DIAGNOSIS — Z0000 Encounter for general adult medical examination without abnormal findings: Secondary | ICD-10-CM

## 2017-08-13 DIAGNOSIS — G43109 Migraine with aura, not intractable, without status migrainosus: Secondary | ICD-10-CM

## 2017-08-13 DIAGNOSIS — Z1211 Encounter for screening for malignant neoplasm of colon: Secondary | ICD-10-CM

## 2017-08-13 LAB — CBC WITH DIFFERENTIAL/PLATELET
BASOS PCT: 0.5 % (ref 0.0–3.0)
Basophils Absolute: 0.1 10*3/uL (ref 0.0–0.1)
EOS ABS: 0.3 10*3/uL (ref 0.0–0.7)
EOS PCT: 2.6 % (ref 0.0–5.0)
HEMATOCRIT: 38.7 % (ref 36.0–46.0)
HEMOGLOBIN: 13.2 g/dL (ref 12.0–15.0)
LYMPHS PCT: 24.8 % (ref 12.0–46.0)
Lymphs Abs: 2.7 10*3/uL (ref 0.7–4.0)
MCHC: 34 g/dL (ref 30.0–36.0)
MCV: 94.2 fl (ref 78.0–100.0)
MONOS PCT: 7.9 % (ref 3.0–12.0)
Monocytes Absolute: 0.9 10*3/uL (ref 0.1–1.0)
Neutro Abs: 7 10*3/uL (ref 1.4–7.7)
Neutrophils Relative %: 64.2 % (ref 43.0–77.0)
Platelets: 364 10*3/uL (ref 150.0–400.0)
RBC: 4.11 Mil/uL (ref 3.87–5.11)
RDW: 13.4 % (ref 11.5–15.5)
WBC: 10.8 10*3/uL — AB (ref 4.0–10.5)

## 2017-08-13 LAB — LIPID PANEL
CHOLESTEROL: 165 mg/dL (ref 0–200)
HDL: 71.3 mg/dL (ref >39.00)
LDL Cholesterol: 68 mg/dL (ref 0–99)
NonHDL: 93.49
TRIGLYCERIDES: 127 mg/dL (ref 0.0–149.0)
Total CHOL/HDL Ratio: 2
VLDL: 25.4 mg/dL (ref 0.0–40.0)

## 2017-08-13 LAB — COMPREHENSIVE METABOLIC PANEL
ALBUMIN: 3.9 g/dL (ref 3.5–5.2)
ALT: 15 U/L (ref 0–35)
AST: 18 U/L (ref 0–37)
Alkaline Phosphatase: 82 U/L (ref 39–117)
BUN: 13 mg/dL (ref 6–23)
CALCIUM: 9.6 mg/dL (ref 8.4–10.5)
CHLORIDE: 101 meq/L (ref 96–112)
CO2: 30 mEq/L (ref 19–32)
CREATININE: 0.85 mg/dL (ref 0.40–1.20)
GFR: 72.28 mL/min (ref >60.00)
Glucose, Bld: 88 mg/dL (ref 70–99)
Potassium: 4 mEq/L (ref 3.5–5.1)
Sodium: 139 mEq/L (ref 135–145)
Total Bilirubin: 0.3 mg/dL (ref 0.2–1.2)
Total Protein: 7 g/dL (ref 6.0–8.3)

## 2017-08-13 LAB — TSH: TSH: 2.67 u[IU]/mL (ref 0.35–4.50)

## 2017-08-13 LAB — HEMOGLOBIN A1C: Hgb A1c MFr Bld: 5.8 % (ref 4.6–6.5)

## 2017-08-13 MED ORDER — AMITRIPTYLINE HCL 100 MG PO TABS: 100.0000 mg | ORAL_TABLET | Freq: Every day | ORAL | 3 refills | Status: DC

## 2017-08-13 MED ORDER — CITALOPRAM HYDROBROMIDE 40 MG PO TABS: 40.0000 mg | ORAL_TABLET | Freq: Every day | ORAL | 3 refills | Status: DC

## 2017-08-13 MED ORDER — ATORVASTATIN CALCIUM 10 MG PO TABS: 10.0000 mg | ORAL_TABLET | Freq: Every day | ORAL | 3 refills | Status: DC

## 2017-08-13 NOTE — Assessment & Plan Note (Signed)
Infrequent migraines imitrex very effective - rarely takes it

## 2017-08-13 NOTE — Assessment & Plan Note (Signed)
Controlled, stable Continue current dose of medication  

## 2017-08-13 NOTE — Assessment & Plan Note (Signed)
Check lipid panel, tsh, cmp  Continue daily statin Regular exercise and healthy diet encouraged  

## 2017-08-13 NOTE — Assessment & Plan Note (Signed)
a1c Tries to eat a low sugar diet Very active, walks for exercise

## 2017-08-14 ENCOUNTER — Encounter: Payer: Self-pay | Admitting: Internal Medicine

## 2017-10-08 ENCOUNTER — Encounter: Payer: Self-pay | Admitting: Gastroenterology

## 2017-11-19 ENCOUNTER — Ambulatory Visit (AMBULATORY_SURGERY_CENTER): Payer: Self-pay

## 2017-11-19 VITALS — Ht 63.5 in | Wt 120.4 lb

## 2017-11-19 DIAGNOSIS — Z1211 Encounter for screening for malignant neoplasm of colon: Secondary | ICD-10-CM

## 2017-11-19 MED ORDER — NA SULFATE-K SULFATE-MG SULF 17.5-3.13-1.6 GM/177ML PO SOLN: 1.0000 | Freq: Once | ORAL | 0 refills | Status: AC

## 2017-11-19 NOTE — Progress Notes (Signed)
Per pt, no allergies to soy or egg products.Pt not taking any weight loss meds or using  O2 at home.  Emmiv video sent to pt's email

## 2017-12-03 ENCOUNTER — Ambulatory Visit (AMBULATORY_SURGERY_CENTER): Payer: Self-pay | Admitting: Gastroenterology

## 2017-12-03 ENCOUNTER — Encounter: Payer: Self-pay | Admitting: Gastroenterology

## 2017-12-03 VITALS — BP 109/75 | HR 81 | Temp 98.0°F | Resp 14 | Ht 63.5 in | Wt 120.4 lb

## 2017-12-03 DIAGNOSIS — Z1211 Encounter for screening for malignant neoplasm of colon: Secondary | ICD-10-CM

## 2017-12-03 MED ORDER — SODIUM CHLORIDE 0.9 % IV SOLN: 500.0000 mL | Freq: Once | INTRAVENOUS | Status: DC

## 2017-12-03 NOTE — Op Note (Signed)
[facility] Patient Name: Selena Torres Procedure Date: 12/03/2017 8:49 AM MRN: [id number] Endoscopist: Thornton Park MD, MD Age: 62 Referring MD:  Date of Birth: Sep 23, 1955 Gender: Female Account #: [id number] Procedure:                Colonoscopy Indications:              Screening for colorectal malignant neoplasm. No                            known family history of colon cancer or polyps. No                            baseline GI symptoms. Medicines:                See the Anesthesia note for documentation of the                            administered medications Procedure:                Pre-Anesthesia Assessment:                           - Prior to the procedure, a History and Physical                            was performed, and patient medications and                            allergies were reviewed. The patient's tolerance of                            previous anesthesia was also reviewed. The risks                            and benefits of the procedure and the sedation                            options and risks were discussed with the patient.                            All questions were answered, and informed consent                            was obtained. Prior Anticoagulants: The patient has                            taken no previous anticoagulant or antiplatelet                            agents. ASA Grade Assessment: II - A patient with                            mild systemic disease. After reviewing the risks  and benefits, the patient was deemed in                            satisfactory condition to undergo the procedure.                           After obtaining informed consent, the colonoscope                            was passed under direct vision. Throughout the                            procedure, the patient's blood pressure, pulse, and                            oxygen saturations were monitored  continuously. The                            Model PCF-H190DL [id number]) scope was introduced                            through the anus and advanced to the the terminal                            ileum, with identification of the appendiceal                            orifice and IC valve. The colonoscopy was performed                            with some difficulty. The colon is tortuous and                            looped. The rectosigmoid junction is fixed. I was                            able to safely navigate through this area with the                            use of abdominal wall pressure, continuous water                            flush, and repositioning of the patient. The                            patient tolerated the procedure well. The quality                            of the bowel preparation was good except for a                            large amount of seeds and fibrous debris in the  cecum. The colonoscope became routinely clogged                            from debris, prolonging the procedure given the                            need to unclog the scope to resume normal suction. Scope In: 8:58:23 AM Scope Out: 9:25:44 AM Scope Withdrawal Time: 0 hours 10 minutes 0 seconds  Total Procedure Duration: 0 hours 27 minutes 21 seconds  Findings:                 The perianal and digital rectal examinations were                            normal except for a small hypertrophied anal                            papillae.                           The entire examined colon appeared normal on direct                            and retroflexion views. Complications:            No immediate complications. Estimated Blood Loss:     Estimated blood loss: none. Impression:               - The entire examined colon is normal on direct and                            retroflexion views.                           - No specimens  collected. Recommendation:           - Discharge patient to home.                           - Resume previous diet.                           - Continue present medications.                           - Continue with routine colon cancer screening                            guidelines. Current guidelines recommend repeating                            the colonoscopy in 10 years or earlier with the                            development of new symptoms. Consider two days of  clear liquids prior to your next colonoscopy given                            the debris that remained in the cecum. Thornton Park MD, MD 12/03/2017 9:35:57 AM This report has been signed electronically.

## 2017-12-03 NOTE — Progress Notes (Signed)
Pt's states no medical or surgical changes since previsit or office visit. 

## 2017-12-03 NOTE — Patient Instructions (Signed)
YOU HAD AN ENDOSCOPIC PROCEDURE TODAY AT THE [facility]:   Refer to the procedure report that was given to you for any specific questions about what was found during the examination.  If the procedure report does not answer your questions, please call your gastroenterologist to clarify.  If you requested that your care partner not be given the details of your procedure findings, then the procedure report has been included in a sealed envelope for you to review at your convenience later.  YOU SHOULD EXPECT: Some feelings of bloating in the abdomen. Passage of more gas than usual.  Walking can help get rid of the air that was put into your GI tract during the procedure and reduce the bloating. If you had a lower endoscopy (such as a colonoscopy or flexible sigmoidoscopy) you may notice spotting of blood in your stool or on the toilet paper. If you underwent a bowel prep for your procedure, you may not have a normal bowel movement for a few days.  Please Note:  You might notice some irritation and congestion in your nose or some drainage.  This is from the oxygen used during your procedure.  There is no need for concern and it should clear up in a day or so.  SYMPTOMS TO REPORT IMMEDIATELY:   Following lower endoscopy (colonoscopy or flexible sigmoidoscopy):  Excessive amounts of blood in the stool  Significant tenderness or worsening of abdominal pains  Swelling of the abdomen that is new, acute  Fever of 100F or higher  For urgent or emergent issues, a gastroenterologist can be reached at any hour by calling [phone].   DIET:  We do recommend a small meal at first, but then you may proceed to your regular diet.  Drink plenty of fluids but you should avoid alcoholic beverages for 24 hours.  MEDICATIONS: Continue present medications.  Please see handouts given to you by your recovery nurse.  ACTIVITY:  You should plan to take it easy for the rest of today and you should NOT  DRIVE or use heavy machinery until tomorrow (because of the sedation medicines used during the test).    FOLLOW UP: Our staff will call the number listed on your records the next business day following your procedure to check on you and address any questions or concerns that you may have regarding the information given to you following your procedure. If we do not reach you, we will leave a message.  However, if you are feeling well and you are not experiencing any problems, there is no need to return our call.  We will assume that you have returned to your regular daily activities without incident.  If any biopsies were taken you will be contacted by phone or by letter within the next 1-3 weeks.  Please call us at [phone] if you have not heard about the biopsies in 3 weeks.   Thank you for allowing us to provide for your healthcare needs today.   SIGNATURES/CONFIDENTIALITY: You and/or your care partner have signed paperwork which will be entered into your electronic medical record.  These signatures attest to the fact that that the information above on your After Visit Summary has been reviewed and is understood.  Full responsibility of the confidentiality of this discharge information lies with you and/or your care-partner. 

## 2017-12-03 NOTE — Progress Notes (Signed)
To PACU, VSS. Report to [email or web address] 

## 2017-12-04 ENCOUNTER — Telehealth: Payer: Self-pay | Admitting: *Deleted

## 2017-12-04 NOTE — Telephone Encounter (Signed)
  Follow up Call-  Call back number 12/03/2017  Post procedure Call Back phone  # [phone]  Permission to leave phone message Yes     Patient questions:  Do you have a fever, pain , or abdominal swelling? No. Pain Score  0 *  Have you tolerated food without any problems? Yes.    Have you been able to return to your normal activities? Yes.    Do you have any questions about your discharge instructions: Diet   No. Medications  No. Follow up visit  No.  Do you have questions or concerns about your Care? No.  Actions: * If pain score is 4 or above: No action needed, pain <4.

## 2018-04-20 NOTE — Progress Notes (Signed)
Subjective:    Patient ID: Selena Torres, female    DOB: Aug 17, 1955, 63 y.o.   MRN: [id number]  HPI She is here for an acute visit for cold symptoms.  Her symptoms started about 3 weeks ago with flu like symptoms.  That improved, but she had a cough since then and it has gotten worse.   She is experiencing productive cough with discolored mucus with green-brown and bloody sputum.  She has mild sob and wheeze.  She has had some headaches.  She denies fever, chills, sinus pain and nasal congestion.    She has taken cough syrup.      Medications and allergies reviewed with patient and updated if appropriate.  Patient Active Problem List   Diagnosis Date Noted  . Hyperglycemia 08/12/2016  . Migraine 07/10/2015  . Hyperlipidemia 07/10/2015  . Anxiety 07/10/2015    Current Outpatient Medications on File Prior to Visit  Medication Sig Dispense Refill  . acetaminophen (TYLENOL) 500 MG tablet Take 500 mg by mouth as needed.    Marland Kitchen amitriptyline (ELAVIL) 100 MG tablet Take 1 tablet (100 mg total) by mouth at bedtime. 90 tablet 3  . aspirin EC 81 MG tablet Take 81 mg by mouth daily.    Marland Kitchen atorvastatin (LIPITOR) 10 MG tablet Take 1 tablet (10 mg total) by mouth daily. 90 tablet 3  . citalopram (CELEXA) 40 MG tablet Take 1 tablet (40 mg total) by mouth daily. 90 tablet 3  . naproxen sodium (ALEVE) 220 MG tablet Take 220 mg by mouth as needed.    . SUMAtriptan (IMITREX) 25 MG tablet Take 1 tablet (25 mg total) by mouth every 2 (two) hours as needed for migraine. May repeat in 2 hours if headache persists or recurs. 10 tablet 11  . vitamin B-12 (CYANOCOBALAMIN) 1000 MCG tablet Take 1,000 mcg by mouth daily.     Current Facility-Administered Medications on File Prior to Visit  Medication Dose Route Frequency Provider Last Rate Last Dose  . 0.9 %  sodium chloride infusion  500 mL Intravenous Once [place], MD        Past Medical History:  Diagnosis Date  . Anemia   . Anxiety   .  Asthma    as a child  . Fibroids    uterine fibroids caused anemia  . Migraine     Past Surgical History:  Procedure Laterality Date  . ABDOMINAL HYSTERECTOMY  1990  . APPENDECTOMY    . BREAST BIOPSY Left 1995  . BREAST EXCISIONAL BIOPSY Left 15+ yrs ago   benign  . OOPHORECTOMY  1994   still have one ovary/ mass on right ovary and was removed     Social History   Socioeconomic History  . Marital status: Married    Spouse name: Not on file  . Number of children: Not on file  . Years of education: Not on file  . Highest education level: Not on file  Occupational History  . Not on file  Social Needs  . Financial resource strain: Not on file  . Food insecurity:    Worry: Not on file    Inability: Not on file  . Transportation needs:    Medical: Not on file    Non-medical: Not on file  Tobacco Use  . Smoking status: Former Smoker    Last attempt to quit: 11/20/1998    Years since quitting: 19.4  . Smokeless tobacco: Never Used  Substance and Sexual Activity  . Alcohol  use: Yes    Comment: occasional  . Drug use: No  . Sexual activity: Not on file  Lifestyle  . Physical activity:    Days per week: Not on file    Minutes per session: Not on file  . Stress: Not on file  Relationships  . Social connections:    Talks on phone: Not on file    Gets together: Not on file    Attends religious service: Not on file    Active member of club or organization: Not on file    Attends meetings of clubs or organizations: Not on file    Relationship status: Not on file  Other Topics Concern  . Not on file  Social History Narrative   Exercise: walks a lot, very active, no regimented exercise    Family History  Problem Relation Age of Onset  . Breast cancer Mother   . Dementia Mother   . Alzheimer's disease Mother   . Alzheimer's disease Father   . HIV Brother     Review of Systems  Constitutional: Negative for chills and fever.  HENT: Negative for congestion, ear  pain, sinus pressure, sinus pain and sore throat.   Respiratory: Positive for cough (productive), shortness of breath (mild) and wheezing. Negative for chest tightness.   Cardiovascular: Negative for chest pain.  Gastrointestinal: Negative for diarrhea and nausea.  Musculoskeletal: Negative for myalgias.  Neurological: Positive for headaches. Negative for light-headedness.       Objective:   Vitals:   04/21/18 0839  BP: 132/88  Pulse: 94  Resp: 16  Temp: 98.4 F (36.9 C)  SpO2: 99%   Filed Weights   04/21/18 0839  Weight: 122 lb 12.8 oz (55.7 kg)   Body mass index is 21.41 kg/m.  Wt Readings from Last 3 Encounters:  04/21/18 122 lb 12.8 oz (55.7 kg)  12/03/17 120 lb 6.4 oz (54.6 kg)  11/19/17 120 lb 6.4 oz (54.6 kg)     Physical Exam GENERAL APPEARANCE: Appears stated age, well appearing, NAD EYES: conjunctiva clear, no icterus HEENT: bilateral tympanic membranes and ear canals normal, oropharynx with no erythema, no thyromegaly, trachea midline, no cervical or supraclavicular lymphadenopathy LUNGS: Clear to auscultation without wheeze or crackles, unlabored breathing, good air entry bilaterally CARDIOVASCULAR: Normal S1,S2 without murmurs, no edema SKIN: warm, dry        Assessment & Plan:   See Problem List for Assessment and Plan of chronic medical problems.

## 2018-04-21 ENCOUNTER — Other Ambulatory Visit: Payer: Self-pay | Admitting: Internal Medicine

## 2018-04-21 ENCOUNTER — Telehealth: Payer: Self-pay | Admitting: Internal Medicine

## 2018-04-21 ENCOUNTER — Ambulatory Visit (INDEPENDENT_AMBULATORY_CARE_PROVIDER_SITE_OTHER): Payer: Self-pay | Admitting: Internal Medicine

## 2018-04-21 ENCOUNTER — Encounter: Payer: Self-pay | Admitting: Internal Medicine

## 2018-04-21 ENCOUNTER — Ambulatory Visit (INDEPENDENT_AMBULATORY_CARE_PROVIDER_SITE_OTHER)
Admission: RE | Admit: 2018-04-21 | Discharge: 2018-04-21 | Disposition: A | Discharge status: Home / Self Care | Payer: Self-pay | Source: Ambulatory Visit | Attending: Internal Medicine | Admitting: Internal Medicine

## 2018-04-21 VITALS — BP 132/88 | HR 94 | Temp 98.4°F | Resp 16 | Ht 63.5 in | Wt 122.8 lb

## 2018-04-21 DIAGNOSIS — J209 Acute bronchitis, unspecified: Secondary | ICD-10-CM

## 2018-04-21 DIAGNOSIS — R062 Wheezing: Secondary | ICD-10-CM

## 2018-04-21 DIAGNOSIS — R911 Solitary pulmonary nodule: Secondary | ICD-10-CM

## 2018-04-21 MED ORDER — DOXYCYCLINE HYCLATE 100 MG PO TABS: 100.0000 mg | ORAL_TABLET | Freq: Two times a day (BID) | ORAL | 0 refills | Status: DC

## 2018-04-21 NOTE — Assessment & Plan Note (Signed)
Secondary to bacterial bronchitis Wheezing is mild - no inhaler or steroids needed   CXR today to r/o PNA Start doxycyline OTC cough syrup otc cold medications Rest, fluid Call if no improvement

## 2018-04-21 NOTE — Patient Instructions (Signed)
Have a chest xray today.  We will call you with the results.    Take the antibiotic as prescribed - complete the entire course.  Use the over the counter cough syrup as needed.  Take over the counter cold medication, advil and tylenol.  Increase your fluids and rest.    Call if no improvement

## 2018-04-21 NOTE — Telephone Encounter (Signed)
See result note.  

## 2018-04-21 NOTE — Telephone Encounter (Signed)
Report called from [facility] radiology . Report called to office [facility].

## 2018-04-21 NOTE — Assessment & Plan Note (Addendum)
Likely bacterial  CXR today to r/o PNA Start doxycyline OTC cough syrup otc cold medications Rest, fluid Call if no improvement

## 2018-04-22 ENCOUNTER — Ambulatory Visit (INDEPENDENT_AMBULATORY_CARE_PROVIDER_SITE_OTHER)
Admission: RE | Admit: 2018-04-22 | Discharge: 2018-04-22 | Disposition: A | Discharge status: Home / Self Care | Payer: Self-pay | Source: Ambulatory Visit | Attending: Internal Medicine | Admitting: Internal Medicine

## 2018-04-22 DIAGNOSIS — R911 Solitary pulmonary nodule: Secondary | ICD-10-CM

## 2018-04-24 ENCOUNTER — Other Ambulatory Visit: Payer: Self-pay | Admitting: Internal Medicine

## 2018-04-24 DIAGNOSIS — J849 Interstitial pulmonary disease, unspecified: Secondary | ICD-10-CM

## 2018-08-16 NOTE — Progress Notes (Signed)
Subjective:    Patient ID: Selena Torres, female    DOB: 1956/02/15, 63 y.o.   MRN: [id number]  HPI She is here for a physical exam.     Medications and allergies reviewed with patient and updated if appropriate.  Patient Active Problem List   Diagnosis Date Noted  . Solitary pulmonary nodule 04/21/2018  . Hyperglycemia 08/12/2016  . Migraine 07/10/2015  . Hyperlipidemia 07/10/2015  . Anxiety 07/10/2015    Current Outpatient Medications on File Prior to Visit  Medication Sig Dispense Refill  . acetaminophen (TYLENOL) 500 MG tablet Take 500 mg by mouth as needed.    Marland Kitchen amitriptyline (ELAVIL) 100 MG tablet Take 1 tablet (100 mg total) by mouth at bedtime. 90 tablet 3  . aspirin EC 81 MG tablet Take 81 mg by mouth daily.    Marland Kitchen atorvastatin (LIPITOR) 10 MG tablet Take 1 tablet (10 mg total) by mouth daily. 90 tablet 3  . citalopram (CELEXA) 40 MG tablet Take 1 tablet (40 mg total) by mouth daily. 90 tablet 3  . naproxen sodium (ALEVE) 220 MG tablet Take 220 mg by mouth as needed.    . SUMAtriptan (IMITREX) 25 MG tablet Take 1 tablet (25 mg total) by mouth every 2 (two) hours as needed for migraine. May repeat in 2 hours if headache persists or recurs. 10 tablet 11  . vitamin B-12 (CYANOCOBALAMIN) 1000 MCG tablet Take 1,000 mcg by mouth daily.     No current facility-administered medications on file prior to visit.     Past Medical History:  Diagnosis Date  . Anemia   . Anxiety   . Asthma    as a child  . Fibroids    uterine fibroids caused anemia  . Migraine     Past Surgical History:  Procedure Laterality Date  . ABDOMINAL HYSTERECTOMY  1990  . APPENDECTOMY    . BREAST BIOPSY Left 1995  . BREAST EXCISIONAL BIOPSY Left 15+ yrs ago   benign  . OOPHORECTOMY  1994   still have one ovary/ mass on right ovary and was removed     Social History   Socioeconomic History  . Marital status: Married    Spouse name: Not on file  . Number of children: Not on file  .  Years of education: Not on file  . Highest education level: Not on file  Occupational History  . Not on file  Social Needs  . Financial resource strain: Not on file  . Food insecurity:    Worry: Not on file    Inability: Not on file  . Transportation needs:    Medical: Not on file    Non-medical: Not on file  Tobacco Use  . Smoking status: Former Smoker    Last attempt to quit: 11/20/1998    Years since quitting: 19.7  . Smokeless tobacco: Never Used  Substance and Sexual Activity  . Alcohol use: Yes    Comment: occasional  . Drug use: No  . Sexual activity: Not on file  Lifestyle  . Physical activity:    Days per week: Not on file    Minutes per session: Not on file  . Stress: Not on file  Relationships  . Social connections:    Talks on phone: Not on file    Gets together: Not on file    Attends religious service: Not on file    Active member of club or organization: Not on file    Attends meetings of  clubs or organizations: Not on file    Relationship status: Not on file  Other Topics Concern  . Not on file  Social History Narrative   Exercise: walks a lot, very active, no regimented exercise    Family History  Problem Relation Age of Onset  . Breast cancer Mother   . Dementia Mother   . Alzheimer's disease Mother   . Alzheimer's disease Father   . HIV Brother     Review of Systems  Constitutional: Negative for chills and fever.  Eyes: Negative for visual disturbance.  Respiratory: Negative for cough, shortness of breath and wheezing.   Cardiovascular: Positive for palpitations. Negative for chest pain.  Gastrointestinal: Positive for constipation. Negative for abdominal pain, blood in stool, diarrhea and nausea.  Genitourinary: Negative for dysuria and hematuria.  Musculoskeletal: Negative for arthralgias.  Skin: Negative for color change and rash.  Neurological: Positive for headaches (migraines). Negative for dizziness and light-headedness.   Psychiatric/Behavioral: Positive for dysphoric mood (controlled). The patient is nervous/anxious (controlled).        Objective:   Vitals:   08/17/18 0754  BP: 128/78  Pulse: 88  Resp: 16  Temp: 98.5 F (36.9 C)  SpO2: 98%   Filed Weights   08/17/18 0754  Weight: 121 lb (54.9 kg)   Body mass index is 21.1 kg/m.  BP Readings from Last 3 Encounters:  08/17/18 128/78  04/21/18 132/88  12/03/17 109/75    Wt Readings from Last 3 Encounters:  08/17/18 121 lb (54.9 kg)  04/21/18 122 lb 12.8 oz (55.7 kg)  12/03/17 120 lb 6.4 oz (54.6 kg)     Physical Exam Constitutional: She appears well-developed and well-nourished. No distress.  HENT:  Head: Normocephalic and atraumatic.  Right Ear: External ear normal. Normal ear canal and TM Left Ear: External ear normal.  Normal ear canal and TM Mouth/Throat: Oropharynx is clear and moist.  Eyes: Conjunctivae and EOM are normal.  Neck: Neck supple. No tracheal deviation present. No thyromegaly present.  No carotid bruit  Cardiovascular: Normal rate, regular rhythm and normal heart sounds.   No murmur heard.  No edema. Pulmonary/Chest: Effort normal and breath sounds normal. No respiratory distress. She has no wheezes. She has no rales.  Breast: deferred   Abdominal: Soft. She exhibits no distension. There is no tenderness.  Lymphadenopathy: She has no cervical adenopathy.  Skin: Skin is warm and dry. She is not diaphoretic.  Psychiatric: She has a normal mood and affect. Her behavior is normal.        Assessment & Plan:   Physical exam: Screening blood work ordered Immunizations   Discussed shingrix, others up to date Colonoscopy   Up to date  Mammogram   Up to date  Gyn     Due - given name of a practice to call and schedule Dexa    Up to date  Eye exams  Due - will schedule Exercise  Very active, some walking Weight   Normal BMI Skin   No concerns Substance abuse     none  See Problem List for Assessment and Plan  of chronic medical problems.  FU in one year

## 2018-08-16 NOTE — Patient Instructions (Addendum)
[facility]  [address]  [place]  [place], [place] [address]  Main: [phone]   Tests ordered today. Your results will be released to [place] (or called to you) after review, usually within 72hours after test completion. If any changes need to be made, you will be notified at that same time.  All other Health Maintenance issues reviewed.   All recommended immunizations and age-appropriate screenings are up-to-date or discussed.  No immunizations administered today.   Medications reviewed and updated.  Changes include :   none  Your prescription(s) have been submitted to your pharmacy. Please take as directed and contact our office if you believe you are having problem(s) with the medication(s).  Please followup in one year   Health Maintenance, Female Adopting a healthy lifestyle and getting preventive care can go a long way to promote health and wellness. Talk with your health care provider about what schedule of regular examinations is right for you. This is a good chance for you to check in with your provider about disease prevention and staying healthy. In between checkups, there are plenty of things you can do on your own. Experts have done a lot of research about which lifestyle changes and preventive measures are most likely to keep you healthy. Ask your health care provider for more information. Weight and diet Eat a healthy diet  Be sure to include plenty of vegetables, fruits, low-fat dairy products, and lean protein.  Do not eat a lot of foods high in solid fats, added sugars, or salt.  Get regular exercise. This is one of the most important things you can do for your health. ? Most adults should exercise for at least 150 minutes each week. The exercise should increase your heart rate and make you sweat (moderate-intensity exercise). ? Most adults should also do strengthening exercises at least twice a week. This is in addition to the  moderate-intensity exercise. Maintain a healthy weight  Body mass index (BMI) is a measurement that can be used to identify possible weight problems. It estimates body fat based on height and weight. Your health care provider can help determine your BMI and help you achieve or maintain a healthy weight.  For females 99 years of age and older: ? A BMI below 18.5 is considered underweight. ? A BMI of 18.5 to 24.9 is normal. ? A BMI of 25 to 29.9 is considered overweight. ? A BMI of 30 and above is considered obese. Watch levels of cholesterol and blood lipids  You should start having your blood tested for lipids and cholesterol at 62 years of age, then have this test every 5 years.  You may need to have your cholesterol levels checked more often if: ? Your lipid or cholesterol levels are high. ? You are older than 62 years of age. ? You are at high risk for heart disease. Cancer screening Lung Cancer  Lung cancer screening is recommended for adults 31-45 years old who are at high risk for lung cancer because of a history of smoking.  A yearly low-dose CT scan of the lungs is recommended for people who: ? Currently smoke. ? Have quit within the past 15 years. ? Have at least a 30-pack-year history of smoking. A pack year is smoking an average of one pack of cigarettes a day for 1 year.  Yearly screening should continue until it has been 15 years since you quit.  Yearly screening should stop if you develop a health  problem that would prevent you from having lung cancer treatment. Breast Cancer  Practice breast self-awareness. This means understanding how your breasts normally appear and feel.  It also means doing regular breast self-exams. Let your health care provider know about any changes, no matter how small.  If you are in your 20s or 30s, you should have a clinical breast exam (CBE) by a health care provider every 1-3 years as part of a regular health exam.  If you are 45 or  older, have a CBE every year. Also consider having a breast X-ray (mammogram) every year.  If you have a family history of breast cancer, talk to your health care provider about genetic screening.  If you are at high risk for breast cancer, talk to your health care provider about having an MRI and a mammogram every year.  Breast cancer gene (BRCA) assessment is recommended for women who have family members with BRCA-related cancers. BRCA-related cancers include: ? Breast. ? Ovarian. ? Tubal. ? Peritoneal cancers.  Results of the assessment will determine the need for genetic counseling and BRCA1 and BRCA2 testing. Cervical Cancer Your health care provider may recommend that you be screened regularly for cancer of the pelvic organs (ovaries, uterus, and vagina). This screening involves a pelvic examination, including checking for microscopic changes to the surface of your cervix (Pap test). You may be encouraged to have this screening done every 3 years, beginning at age 64.  For women ages 58-65, health care providers may recommend pelvic exams and Pap testing every 3 years, or they may recommend the Pap and pelvic exam, combined with testing for human papilloma virus (HPV), every 5 years. Some types of HPV increase your risk of cervical cancer. Testing for HPV may also be done on women of any age with unclear Pap test results.  Other health care providers may not recommend any screening for nonpregnant women who are considered low risk for pelvic cancer and who do not have symptoms. Ask your health care provider if a screening pelvic exam is right for you.  If you have had past treatment for cervical cancer or a condition that could lead to cancer, you need Pap tests and screening for cancer for at least 20 years after your treatment. If Pap tests have been discontinued, your risk factors (such as having a new sexual partner) need to be reassessed to determine if screening should resume. Some  women have medical problems that increase the chance of getting cervical cancer. In these cases, your health care provider may recommend more frequent screening and Pap tests. Colorectal Cancer  This type of cancer can be detected and often prevented.  Routine colorectal cancer screening usually begins at 62 years of age and continues through 62 years of age.  Your health care provider may recommend screening at an earlier age if you have risk factors for colon cancer.  Your health care provider may also recommend using home test kits to check for hidden blood in the stool.  A small camera at the end of a tube can be used to examine your colon directly (sigmoidoscopy or colonoscopy). This is done to check for the earliest forms of colorectal cancer.  Routine screening usually begins at age 43.  Direct examination of the colon should be repeated every 5-10 years through 62 years of age. However, you may need to be screened more often if early forms of precancerous polyps or small growths are found. Skin Cancer  Check your skin  from head to toe regularly.  Tell your health care provider about any new moles or changes in moles, especially if there is a change in a mole's shape or color.  Also tell your health care provider if you have a mole that is larger than the size of a pencil eraser.  Always use sunscreen. Apply sunscreen liberally and repeatedly throughout the day.  Protect yourself by wearing long sleeves, pants, a wide-brimmed hat, and sunglasses whenever you are outside. Heart disease, diabetes, and high blood pressure  High blood pressure causes heart disease and increases the risk of stroke. High blood pressure is more likely to develop in: ? People who have blood pressure in the high end of the normal range (130-139/85-89 mm Hg). ? People who are overweight or obese. ? People who are African American.  If you are 47-55 years of age, have your blood pressure checked every  3-5 years. If you are 110 years of age or older, have your blood pressure checked every year. You should have your blood pressure measured twice-once when you are at a hospital or clinic, and once when you are not at a hospital or clinic. Record the average of the two measurements. To check your blood pressure when you are not at a hospital or clinic, you can use: ? An automated blood pressure machine at a pharmacy. ? A home blood pressure monitor.  If you are between 40 years and 91 years old, ask your health care provider if you should take aspirin to prevent strokes.  Have regular diabetes screenings. This involves taking a blood sample to check your fasting blood sugar level. ? If you are at a normal weight and have a low risk for diabetes, have this test once every three years after 62 years of age. ? If you are overweight and have a high risk for diabetes, consider being tested at a younger age or more often. Preventing infection Hepatitis B  If you have a higher risk for hepatitis B, you should be screened for this virus. You are considered at high risk for hepatitis B if: ? You were born in a country where hepatitis B is common. Ask your health care provider which countries are considered high risk. ? Your parents were born in a high-risk country, and you have not been immunized against hepatitis B (hepatitis B vaccine). ? You have HIV or AIDS. ? You use needles to inject street drugs. ? You live with someone who has hepatitis B. ? You have had sex with someone who has hepatitis B. ? You get hemodialysis treatment. ? You take certain medicines for conditions, including cancer, organ transplantation, and autoimmune conditions. Hepatitis C  Blood testing is recommended for: ? Everyone born from [birth year] through 1964. ? Anyone with known risk factors for hepatitis C. Sexually transmitted infections (STIs)  You should be screened for sexually transmitted infections (STIs) including  gonorrhea and chlamydia if: ? You are sexually active and are younger than 62 years of age. ? You are older than 62 years of age and your health care provider tells you that you are at risk for this type of infection. ? Your sexual activity has changed since you were last screened and you are at an increased risk for chlamydia or gonorrhea. Ask your health care provider if you are at risk.  If you do not have HIV, but are at risk, it may be recommended that you take a prescription medicine daily to prevent HIV infection.  This is called pre-exposure prophylaxis (PrEP). You are considered at risk if: ? You are sexually active and do not regularly use condoms or know the HIV status of your partner(s). ? You take drugs by injection. ? You are sexually active with a partner who has HIV. Talk with your health care provider about whether you are at high risk of being infected with HIV. If you choose to begin PrEP, you should first be tested for HIV. You should then be tested every 3 months for as long as you are taking PrEP. Pregnancy  If you are premenopausal and you may become pregnant, ask your health care provider about preconception counseling.  If you may become pregnant, take 400 to 800 micrograms (mcg) of folic acid every day.  If you want to prevent pregnancy, talk to your health care provider about birth control (contraception). Osteoporosis and menopause  Osteoporosis is a disease in which the bones lose minerals and strength with aging. This can result in serious bone fractures. Your risk for osteoporosis can be identified using a bone density scan.  If you are 72 years of age or older, or if you are at risk for osteoporosis and fractures, ask your health care provider if you should be screened.  Ask your health care provider whether you should take a calcium or vitamin D supplement to lower your risk for osteoporosis.  Menopause may have certain physical symptoms and risks.  Hormone  replacement therapy may reduce some of these symptoms and risks. Talk to your health care provider about whether hormone replacement therapy is right for you. Follow these instructions at home:  Schedule regular health, dental, and eye exams.  Stay current with your immunizations.  Do not use any tobacco products including cigarettes, chewing tobacco, or electronic cigarettes.  If you are pregnant, do not drink alcohol.  If you are breastfeeding, limit how much and how often you drink alcohol.  Limit alcohol intake to no more than 1 drink per day for nonpregnant women. One drink equals 12 ounces of beer, 5 ounces of wine, or 1 ounces of hard liquor.  Do not use street drugs.  Do not share needles.  Ask your health care provider for help if you need support or information about quitting drugs.  Tell your health care provider if you often feel depressed.  Tell your health care provider if you have ever been abused or do not feel safe at home. This information is not intended to replace advice given to you by your health care provider. Make sure you discuss any questions you have with your health care provider. Document Released: 09/09/2010 Document Revised: 08/02/2015 Document Reviewed: 11/28/2014 Elsevier Interactive Patient Education  2019 [employer].

## 2018-08-17 ENCOUNTER — Encounter: Payer: Self-pay | Admitting: Internal Medicine

## 2018-08-17 ENCOUNTER — Other Ambulatory Visit (INDEPENDENT_AMBULATORY_CARE_PROVIDER_SITE_OTHER): Payer: Self-pay

## 2018-08-17 ENCOUNTER — Ambulatory Visit (INDEPENDENT_AMBULATORY_CARE_PROVIDER_SITE_OTHER): Payer: Self-pay | Admitting: Internal Medicine

## 2018-08-17 ENCOUNTER — Other Ambulatory Visit: Payer: Self-pay

## 2018-08-17 VITALS — BP 128/78 | HR 88 | Temp 98.5°F | Resp 16 | Ht 63.5 in | Wt 121.0 lb

## 2018-08-17 DIAGNOSIS — G43109 Migraine with aura, not intractable, without status migrainosus: Secondary | ICD-10-CM

## 2018-08-17 DIAGNOSIS — Z0000 Encounter for general adult medical examination without abnormal findings: Secondary | ICD-10-CM

## 2018-08-17 DIAGNOSIS — E782 Mixed hyperlipidemia: Secondary | ICD-10-CM

## 2018-08-17 DIAGNOSIS — F419 Anxiety disorder, unspecified: Secondary | ICD-10-CM

## 2018-08-17 DIAGNOSIS — R739 Hyperglycemia, unspecified: Secondary | ICD-10-CM

## 2018-08-17 LAB — COMPREHENSIVE METABOLIC PANEL
ALT: 26 U/L (ref 0–35)
AST: 23 U/L (ref 0–37)
Albumin: 4.2 g/dL (ref 3.5–5.2)
Alkaline Phosphatase: 61 U/L (ref 39–117)
BUN: 18 mg/dL (ref 6–23)
CO2: 29 mEq/L (ref 19–32)
Calcium: 9.5 mg/dL (ref 8.4–10.5)
Chloride: 103 mEq/L (ref 96–112)
Creatinine, Ser: 0.98 mg/dL (ref 0.40–1.20)
GFR: 57.51 mL/min — ABNORMAL LOW (ref >60.00)
Glucose, Bld: 88 mg/dL (ref 70–99)
Potassium: 3.5 mEq/L (ref 3.5–5.1)
Sodium: 141 mEq/L (ref 135–145)
Total Bilirubin: 0.6 mg/dL (ref 0.2–1.2)
Total Protein: 6.7 g/dL (ref 6.0–8.3)

## 2018-08-17 LAB — CBC WITH DIFFERENTIAL/PLATELET
Basophils Absolute: 0.1 10*3/uL (ref 0.0–0.1)
Basophils Relative: 1.1 % (ref 0.0–3.0)
Eosinophils Absolute: 0.5 10*3/uL (ref 0.0–0.7)
Eosinophils Relative: 5.7 % — ABNORMAL HIGH (ref 0.0–5.0)
HCT: 39.2 % (ref 36.0–46.0)
Hemoglobin: 13.2 g/dL (ref 12.0–15.0)
Lymphocytes Relative: 34.4 % (ref 12.0–46.0)
Lymphs Abs: 3 10*3/uL (ref 0.7–4.0)
MCHC: 33.8 g/dL (ref 30.0–36.0)
MCV: 95.4 fl (ref 78.0–100.0)
Monocytes Absolute: 0.5 10*3/uL (ref 0.1–1.0)
Monocytes Relative: 6.2 % (ref 3.0–12.0)
Neutro Abs: 4.6 10*3/uL (ref 1.4–7.7)
Neutrophils Relative %: 52.6 % (ref 43.0–77.0)
Platelets: 269 10*3/uL (ref 150.0–400.0)
RBC: 4.11 Mil/uL (ref 3.87–5.11)
RDW: 13.5 % (ref 11.5–15.5)
WBC: 8.7 10*3/uL (ref 4.0–10.5)

## 2018-08-17 LAB — HEMOGLOBIN A1C: Hgb A1c MFr Bld: 5.8 % (ref 4.6–6.5)

## 2018-08-17 LAB — LIPID PANEL
Cholesterol: 182 mg/dL (ref 0–200)
HDL: 84.4 mg/dL (ref >39.00)
LDL Cholesterol: 78 mg/dL (ref 0–99)
NonHDL: 97.93
Total CHOL/HDL Ratio: 2
Triglycerides: 99 mg/dL (ref 0.0–149.0)
VLDL: 19.8 mg/dL (ref 0.0–40.0)

## 2018-08-17 LAB — TSH: TSH: 1.69 u[IU]/mL (ref 0.35–4.50)

## 2018-08-17 MED ORDER — AMITRIPTYLINE HCL 100 MG PO TABS: 100.0000 mg | ORAL_TABLET | Freq: Every day | ORAL | 3 refills | Status: DC

## 2018-08-17 MED ORDER — SUMATRIPTAN SUCCINATE 25 MG PO TABS: 25.0000 mg | ORAL_TABLET | ORAL | 11 refills | Status: DC | PRN

## 2018-08-17 MED ORDER — CITALOPRAM HYDROBROMIDE 40 MG PO TABS: 40.0000 mg | ORAL_TABLET | Freq: Every day | ORAL | 3 refills | Status: DC

## 2018-08-17 MED ORDER — ATORVASTATIN CALCIUM 10 MG PO TABS: 10.0000 mg | ORAL_TABLET | Freq: Every day | ORAL | 3 refills | Status: DC

## 2018-08-17 NOTE — Assessment & Plan Note (Signed)
Taking elavil daily and imitrex prn Has had one migraine in the past several months Continue above

## 2018-08-17 NOTE — Assessment & Plan Note (Signed)
a1c

## 2018-08-17 NOTE — Assessment & Plan Note (Signed)
Controlled, stable Continue current dose of medication  

## 2018-08-17 NOTE — Assessment & Plan Note (Signed)
Check lipids, tsh, cmp Healthy diet and exercise recommended

## 2018-08-18 ENCOUNTER — Encounter: Payer: Self-pay | Admitting: Internal Medicine

## 2018-08-20 ENCOUNTER — Encounter: Payer: Self-pay | Admitting: Internal Medicine

## 2018-09-14 ENCOUNTER — Telehealth: Payer: Self-pay | Admitting: *Deleted

## 2018-09-14 NOTE — Telephone Encounter (Signed)

## 2018-09-15 ENCOUNTER — Ambulatory Visit (INDEPENDENT_AMBULATORY_CARE_PROVIDER_SITE_OTHER)
Admission: RE | Admit: 2018-09-15 | Discharge: 2018-09-15 | Disposition: A | Discharge status: Home / Self Care | Payer: Self-pay | Source: Ambulatory Visit | Attending: Internal Medicine | Admitting: Internal Medicine

## 2018-09-15 ENCOUNTER — Other Ambulatory Visit: Payer: Self-pay

## 2018-09-15 DIAGNOSIS — J849 Interstitial pulmonary disease, unspecified: Secondary | ICD-10-CM

## 2018-09-16 ENCOUNTER — Encounter: Payer: Self-pay | Admitting: Internal Medicine

## 2018-09-16 DIAGNOSIS — J479 Bronchiectasis, uncomplicated: Secondary | ICD-10-CM | POA: Insufficient documentation

## 2019-06-09 ENCOUNTER — Ambulatory Visit: Payer: Self-pay | Attending: Internal Medicine

## 2019-06-10 ENCOUNTER — Ambulatory Visit: Payer: Self-pay | Attending: Internal Medicine

## 2019-06-10 ENCOUNTER — Ambulatory Visit: Payer: Self-pay

## 2019-06-10 DIAGNOSIS — Z23 Encounter for immunization: Secondary | ICD-10-CM

## 2019-06-10 NOTE — Progress Notes (Signed)
   Covid-19 Vaccination Clinic  Name:  Denessa Humphres    MRN: [id number] DOB: 1955/03/13  06/10/2019  Ms. Gallaway was observed post Covid-19 immunization for 15 minutes without incident. She was provided with Vaccine Information Sheet and instruction to access the V-Safe system.   Ms. Yott was instructed to call 911 with any severe reactions post vaccine: Marland Kitchen Difficulty breathing  . Swelling of face and throat  . A fast heartbeat  . A bad rash all over body  . Dizziness and weakness   Immunizations Administered    Name Date Dose VIS Date Route   Moderna COVID-19 Vaccine 06/10/2019  8:01 AM 0.5 mL 02/08/2019 Intramuscular   Manufacturer: Moderna   Lot: [id number]   [id number]

## 2019-07-12 ENCOUNTER — Ambulatory Visit: Payer: Self-pay | Attending: Internal Medicine

## 2019-07-12 DIAGNOSIS — Z23 Encounter for immunization: Secondary | ICD-10-CM

## 2019-07-12 NOTE — Progress Notes (Signed)
   Covid-19 Vaccination Clinic  Name:  Selena Torres    MRN: [id number] DOB: January 18, 1956  07/12/2019  [name] was observed post Covid-19 immunization for 15 minutes without incident. She was provided with Vaccine Information Sheet and instruction to access the V-Safe system.   [name] was instructed to call 911 with any severe reactions post vaccine: Marland Kitchen Difficulty breathing  . Swelling of face and throat  . A fast heartbeat  . A bad rash all over body  . Dizziness and weakness   Immunizations Administered    Name Date Dose VIS Date Route   Moderna COVID-19 Vaccine 07/12/2019  8:01 AM 0.5 mL 02/2019 Intramuscular   Manufacturer: Moderna   Lot: [id number]   [place]:[id number]

## 2019-08-17 NOTE — Patient Instructions (Addendum)
[facility]  [place]  [place], [place] [address]  Main: [place] [address] [place], [place] [address] [phone]   [facility] [place] [facility] [address] [place], [place] Across the street from [facility] [phone]     Blood work was ordered.    All other Health Maintenance issues reviewed.   All recommended immunizations and age-appropriate screenings are up-to-date or discussed.  No immunization administered today.   Medications reviewed and updated.  Changes include : none    Your prescription(s) have been submitted to your pharmacy. Please take as directed and contact our office if you believe you are having problem(s) with the medication(s).   Please followup in 1 year   Health Maintenance, Female Adopting a healthy lifestyle and getting preventive care are important in promoting health and wellness. Ask your health care provider about:  The right schedule for you to have regular tests and exams.  Things you can do on your own to prevent diseases and keep yourself healthy. What should I know about diet, weight, and exercise? Eat a healthy diet   Eat a diet that includes plenty of vegetables, fruits, low-fat dairy products, and lean protein.  Do not eat a lot of foods that are high in solid fats, added sugars, or sodium. Maintain a healthy weight Body mass index (BMI) is used to identify weight problems. It estimates body fat based on height and weight. Your health care provider can help determine your BMI and help you achieve or maintain a healthy weight. Get regular exercise Get regular exercise. This is one of the most important things you can do for your health. Most adults should:  Exercise for at least 150 minutes each week. The exercise should increase your heart rate and make you  sweat (moderate-intensity exercise).  Do strengthening exercises at least twice a week. This is in addition to the moderate-intensity exercise.  Spend less time sitting. Even light physical activity can be beneficial. Watch cholesterol and blood lipids Have your blood tested for lipids and cholesterol at 63 years of age, then have this test every 5 years. Have your cholesterol levels checked more often if:  Your lipid or cholesterol levels are high.  You are older than 63 years of age.  You are at high risk for heart disease. What should I know about cancer screening? Depending on your health history and family history, you may need to have cancer screening at various ages. This may include screening for:  Breast cancer.  Cervical cancer.  Colorectal cancer.  Skin cancer.  Lung cancer. What should I know about heart disease, diabetes, and high blood pressure? Blood pressure and heart disease  High blood pressure causes heart disease and increases the risk of stroke. This is more likely to develop in people who have high blood pressure readings, are of African descent, or are overweight.  Have your blood pressure checked: ? Every 3-5 years if you are 12-69 years of age. ? Every year if you are 2 years old or older. Diabetes Have regular diabetes screenings. This checks your fasting blood sugar level. Have the screening done:  Once every three years after age 14 if you are at a normal weight and have a low risk for diabetes.  More often and at a younger age if you are overweight or have a high risk for diabetes. What  should I know about preventing infection? Hepatitis B If you have a higher risk for hepatitis B, you should be screened for this virus. Talk with your health care provider to find out if you are at risk for hepatitis B infection. Hepatitis C Testing is recommended for:  Everyone born from [birth year] through 1964.  Anyone with known risk factors for hepatitis  C. Sexually transmitted infections (STIs)  Get screened for STIs, including gonorrhea and chlamydia, if: ? You are sexually active and are younger than 63 years of age. ? You are older than 63 years of age and your health care provider tells you that you are at risk for this type of infection. ? Your sexual activity has changed since you were last screened, and you are at increased risk for chlamydia or gonorrhea. Ask your health care provider if you are at risk.  Ask your health care provider about whether you are at high risk for HIV. Your health care provider may recommend a prescription medicine to help prevent HIV infection. If you choose to take medicine to prevent HIV, you should first get tested for HIV. You should then be tested every 3 months for as long as you are taking the medicine. Pregnancy  If you are about to stop having your period (premenopausal) and you may become pregnant, seek counseling before you get pregnant.  Take 400 to 800 micrograms (mcg) of folic acid every day if you become pregnant.  Ask for birth control (contraception) if you want to prevent pregnancy. Osteoporosis and menopause Osteoporosis is a disease in which the bones lose minerals and strength with aging. This can result in bone fractures. If you are 85 years old or older, or if you are at risk for osteoporosis and fractures, ask your health care provider if you should:  Be screened for bone loss.  Take a calcium or vitamin D supplement to lower your risk of fractures.  Be given hormone replacement therapy (HRT) to treat symptoms of menopause. Follow these instructions at home: Lifestyle  Do not use any products that contain nicotine or tobacco, such as cigarettes, e-cigarettes, and chewing tobacco. If you need help quitting, ask your health care provider.  Do not use street drugs.  Do not share needles.  Ask your health care provider for help if you need support or information about quitting  drugs. Alcohol use  Do not drink alcohol if: ? Your health care provider tells you not to drink. ? You are pregnant, may be pregnant, or are planning to become pregnant.  If you drink alcohol: ? Limit how much you use to 0-1 drink a day. ? Limit intake if you are breastfeeding.  Be aware of how much alcohol is in your drink. In the [place] one drink equals one 12 oz bottle of beer (355 mL), one 5 oz glass of wine (148 mL), or one 1 oz glass of hard liquor (44 mL). General instructions  Schedule regular health, dental, and eye exams.  Stay current with your vaccines.  Tell your health care provider if: ? You often feel depressed. ? You have ever been abused or do not feel safe at home. Summary  Adopting a healthy lifestyle and getting preventive care are important in promoting health and wellness.  Follow your health care provider's instructions about healthy diet, exercising, and getting tested or screened for diseases.  Follow your health care provider's instructions on monitoring your cholesterol and blood pressure. This information is not intended to replace  advice given to you by your health care provider. Make sure you discuss any questions you have with your health care provider. Document Revised: 02/17/2018 Document Reviewed: 02/17/2018 Elsevier Patient Education  2020 [employer].

## 2019-08-17 NOTE — Progress Notes (Signed)
Subjective:    Patient ID: Selena Torres, female    DOB: May 13, 1955, 64 y.o.   MRN: [id number]  HPI She is here for a physical exam.   She had a deer tick bite yesterday - it was on less than one day.  It itches a little.    Medications and allergies reviewed with patient and updated if appropriate.  Patient Active Problem List   Diagnosis Date Noted  . Bronchiectasis ([place]) 09/16/2018  . Solitary pulmonary nodule 04/21/2018  . Hyperglycemia 08/12/2016  . Migraine 07/10/2015  . Hyperlipidemia 07/10/2015  . Anxiety 07/10/2015    Current Outpatient Medications on File Prior to Visit  Medication Sig Dispense Refill  . acetaminophen (TYLENOL) 500 MG tablet Take 500 mg by mouth as needed.    Marland Kitchen amitriptyline (ELAVIL) 100 MG tablet Take 1 tablet (100 mg total) by mouth at bedtime. 90 tablet 3  . aspirin EC 81 MG tablet Take 81 mg by mouth daily.    Marland Kitchen atorvastatin (LIPITOR) 10 MG tablet Take 1 tablet (10 mg total) by mouth daily. 90 tablet 3  . citalopram (CELEXA) 40 MG tablet Take 1 tablet (40 mg total) by mouth daily. 90 tablet 3  . naproxen sodium (ALEVE) 220 MG tablet Take 220 mg by mouth as needed.    . SUMAtriptan (IMITREX) 25 MG tablet Take 1 tablet (25 mg total) by mouth every 2 (two) hours as needed for migraine. May repeat in 2 hours if headache persists or recurs. 10 tablet 11  . vitamin B-12 (CYANOCOBALAMIN) 1000 MCG tablet Take 1,000 mcg by mouth daily.     No current facility-administered medications on file prior to visit.    Past Medical History:  Diagnosis Date  . Anemia   . Anxiety   . Asthma    as a child  . Fibroids    uterine fibroids caused anemia  . Migraine     Past Surgical History:  Procedure Laterality Date  . ABDOMINAL HYSTERECTOMY  1990  . APPENDECTOMY    . BREAST BIOPSY Left 1995  . BREAST EXCISIONAL BIOPSY Left 15+ yrs ago   benign  . OOPHORECTOMY  1994   still have one ovary/ mass on right ovary and was removed     Social History    Socioeconomic History  . Marital status: Married    Spouse name: Not on file  . Number of children: Not on file  . Years of education: Not on file  . Highest education level: Not on file  Occupational History  . Not on file  Tobacco Use  . Smoking status: Former Smoker    Quit date: 11/20/1998    Years since quitting: 20.7  . Smokeless tobacco: Never Used  Substance and Sexual Activity  . Alcohol use: Yes    Comment: occasional  . Drug use: No  . Sexual activity: Not on file  Other Topics Concern  . Not on file  Social History Narrative   Exercise: walks a lot, very active, no regimented exercise   Social Determinants of Health   Financial Resource Strain:   . Difficulty of Paying Living Expenses:   Food Insecurity:   . Worried About Charity fundraiser in the Last Year:   . Arboriculturist in the Last Year:   Transportation Needs:   . Film/video editor (Medical):   Marland Kitchen Lack of Transportation (Non-Medical):   Physical Activity:   . Days of Exercise per Week:   .  Minutes of Exercise per Session:   Stress:   . Feeling of Stress :   Social Connections:   . Frequency of Communication with Friends and Family:   . Frequency of Social Gatherings with Friends and Family:   . Attends Religious Services:   . Active Member of Clubs or Organizations:   . Attends Archivist Meetings:   Marland Kitchen Marital Status:     Family History  Problem Relation Age of Onset  . Breast cancer Mother   . Dementia Mother   . Alzheimer's disease Mother   . Alzheimer's disease Father   . HIV Brother     Review of Systems  Constitutional: Negative for chills and fever.  HENT: Positive for congestion and postnasal drip.   Eyes: Negative for visual disturbance.  Respiratory: Positive for cough (from allergies) and shortness of breath (occ, with strenuous exertion). Negative for wheezing.   Cardiovascular: Positive for palpitations (with stress). Negative for chest pain and leg  swelling.  Gastrointestinal: Positive for constipation. Negative for abdominal pain, blood in stool, diarrhea and nausea.       No gerd  Genitourinary: Negative for dysuria and hematuria.  Musculoskeletal: Negative for arthralgias.  Skin: Negative for rash.  Neurological: Positive for light-headedness and headaches.  Psychiatric/Behavioral: Negative for dysphoric mood. The patient is nervous/anxious (situationally increased, overall controlled).        Objective:   Vitals:   08/18/19 0836  BP: 110/78  Pulse: 98  Temp: 98.1 F (36.7 C)  SpO2: 99%   Filed Weights   08/18/19 0836  Weight: 113 lb 12.8 oz (51.6 kg)   Body mass index is 19.84 kg/m.  BP Readings from Last 3 Encounters:  08/18/19 110/78  08/17/18 128/78  04/21/18 132/88    Wt Readings from Last 3 Encounters:  08/18/19 113 lb 12.8 oz (51.6 kg)  08/17/18 121 lb (54.9 kg)  04/21/18 122 lb 12.8 oz (55.7 kg)     Physical Exam Constitutional: She appears well-developed and well-nourished. No distress.  HENT:  Head: Normocephalic and atraumatic.  Right Ear: External ear normal. Normal ear canal and TM Left Ear: External ear normal.  Normal ear canal and TM Mouth/Throat: Oropharynx is clear and moist.  Eyes: Conjunctivae and EOM are normal.  Neck: Neck supple. No tracheal deviation present. No thyromegaly present.  No carotid bruit  Cardiovascular: Normal rate, regular rhythm and normal heart sounds.   No murmur heard.  No edema. Pulmonary/Chest: Effort normal and breath sounds normal. No respiratory distress. She has no wheezes. She has no rales.  Breast: deferred   Abdominal: Soft. She exhibits no distension. There is no tenderness.  Lymphadenopathy: She has no cervical adenopathy.  Skin: Skin is warm and dry. She is not diaphoretic.  Psychiatric: She has a normal mood and affect. Her behavior is normal.        Assessment & Plan:   Physical exam: Screening blood work    ordered Immunizations   Discussed shingrix, others up to date Colonoscopy  Up to date  Mammogram  Due -  Will schedule Gyn  - due - will schedule Dexa  Up to date  Eye exams   Up to date  Exercise  Very active Weight  Normal BMI Substance abuse  none  See Problem List for Assessment and Plan of chronic medical problems.     This visit occurred during the SARS-CoV-2 public health emergency.  Safety protocols were in place, including screening questions prior to the visit, additional  usage of staff PPE, and extensive cleaning of exam room while observing appropriate contact time as indicated for disinfecting solutions.

## 2019-08-18 ENCOUNTER — Other Ambulatory Visit: Payer: Self-pay

## 2019-08-18 ENCOUNTER — Ambulatory Visit (INDEPENDENT_AMBULATORY_CARE_PROVIDER_SITE_OTHER): Payer: Self-pay | Admitting: Internal Medicine

## 2019-08-18 ENCOUNTER — Encounter: Payer: Self-pay | Admitting: Internal Medicine

## 2019-08-18 VITALS — BP 110/78 | HR 98 | Temp 98.1°F | Ht 63.5 in | Wt 113.8 lb

## 2019-08-18 DIAGNOSIS — G43109 Migraine with aura, not intractable, without status migrainosus: Secondary | ICD-10-CM

## 2019-08-18 DIAGNOSIS — F419 Anxiety disorder, unspecified: Secondary | ICD-10-CM

## 2019-08-18 DIAGNOSIS — E782 Mixed hyperlipidemia: Secondary | ICD-10-CM

## 2019-08-18 DIAGNOSIS — R739 Hyperglycemia, unspecified: Secondary | ICD-10-CM

## 2019-08-18 LAB — COMPREHENSIVE METABOLIC PANEL
ALT: 20 U/L (ref 0–35)
AST: 23 U/L (ref 0–37)
Albumin: 4.6 g/dL (ref 3.5–5.2)
Alkaline Phosphatase: 59 U/L (ref 39–117)
BUN: 20 mg/dL (ref 6–23)
CO2: 28 mEq/L (ref 19–32)
Calcium: 9.8 mg/dL (ref 8.4–10.5)
Chloride: 101 mEq/L (ref 96–112)
Creatinine, Ser: 0.92 mg/dL (ref 0.40–1.20)
GFR: 61.66 mL/min (ref >60.00)
Glucose, Bld: 95 mg/dL (ref 70–99)
Potassium: 4 mEq/L (ref 3.5–5.1)
Sodium: 138 mEq/L (ref 135–145)
Total Bilirubin: 0.6 mg/dL (ref 0.2–1.2)
Total Protein: 7.1 g/dL (ref 6.0–8.3)

## 2019-08-18 LAB — LIPID PANEL
Cholesterol: 173 mg/dL (ref 0–200)
HDL: 87.2 mg/dL (ref >39.00)
LDL Cholesterol: 71 mg/dL (ref 0–99)
NonHDL: 85.64
Total CHOL/HDL Ratio: 2
Triglycerides: 74 mg/dL (ref 0.0–149.0)
VLDL: 14.8 mg/dL (ref 0.0–40.0)

## 2019-08-18 LAB — CBC WITH DIFFERENTIAL/PLATELET
Basophils Absolute: 0.1 10*3/uL (ref 0.0–0.1)
Basophils Relative: 0.9 % (ref 0.0–3.0)
Eosinophils Absolute: 0.2 10*3/uL (ref 0.0–0.7)
Eosinophils Relative: 2.6 % (ref 0.0–5.0)
HCT: 39.7 % (ref 36.0–46.0)
Hemoglobin: 13.4 g/dL (ref 12.0–15.0)
Lymphocytes Relative: 29.8 % (ref 12.0–46.0)
Lymphs Abs: 2.5 10*3/uL (ref 0.7–4.0)
MCHC: 33.6 g/dL (ref 30.0–36.0)
MCV: 96.2 fl (ref 78.0–100.0)
Monocytes Absolute: 0.6 10*3/uL (ref 0.1–1.0)
Monocytes Relative: 7.3 % (ref 3.0–12.0)
Neutro Abs: 5 10*3/uL (ref 1.4–7.7)
Neutrophils Relative %: 59.4 % (ref 43.0–77.0)
Platelets: 260 10*3/uL (ref 150.0–400.0)
RBC: 4.13 Mil/uL (ref 3.87–5.11)
RDW: 13.1 % (ref 11.5–15.5)
WBC: 8.5 10*3/uL (ref 4.0–10.5)

## 2019-08-18 LAB — TSH: TSH: 1.52 u[IU]/mL (ref 0.35–4.50)

## 2019-08-18 MED ORDER — SUMATRIPTAN SUCCINATE 25 MG PO TABS: 25.0000 mg | ORAL_TABLET | ORAL | 11 refills | Status: DC | PRN

## 2019-08-18 MED ORDER — ATORVASTATIN CALCIUM 10 MG PO TABS: 10.0000 mg | ORAL_TABLET | Freq: Every day | ORAL | 3 refills | Status: DC

## 2019-08-18 MED ORDER — CITALOPRAM HYDROBROMIDE 40 MG PO TABS: 40.0000 mg | ORAL_TABLET | Freq: Every day | ORAL | 3 refills | Status: DC

## 2019-08-18 MED ORDER — AMITRIPTYLINE HCL 100 MG PO TABS: 100.0000 mg | ORAL_TABLET | Freq: Every day | ORAL | 3 refills | Status: DC

## 2019-08-18 NOTE — Assessment & Plan Note (Signed)
Chronic Taking elavil daily and imitrex prn Gets one migraine every three months or so Continue current rx

## 2019-08-18 NOTE — Assessment & Plan Note (Signed)
Chronic Controlled, stable Continue current dose of medication celexa 40 mg daily 

## 2019-08-18 NOTE — Assessment & Plan Note (Signed)
Chronic Check lipid panel  Continue daily statin Regular exercise and healthy diet encouraged  

## 2019-08-18 NOTE — Assessment & Plan Note (Signed)
Chronic Check a1c Low sugar / carb diet Stressed regular exercise  

## 2019-08-19 LAB — HEMOGLOBIN A1C: Hgb A1c MFr Bld: 5.7 % (ref 4.6–6.5)

## 2020-04-24 ENCOUNTER — Encounter (INDEPENDENT_AMBULATORY_CARE_PROVIDER_SITE_OTHER): Payer: Self-pay

## 2020-08-06 ENCOUNTER — Other Ambulatory Visit: Payer: Self-pay | Admitting: Internal Medicine

## 2020-08-19 NOTE — Patient Instructions (Addendum)
Call and schedule your mammogram -  [facility] Schedule an appointment by calling [phone]    Blood work was ordered.     Medications changes include :   none  Your prescription(s) have been submitted to your pharmacy. Please take as directed and contact our office if you believe you are having problem(s) with the medication(s).   Please followup in 6 months   [place]: [place] [phone]      Health Maintenance, Female Adopting a healthy lifestyle and getting preventive care are important in promoting health and wellness. Ask your health care provider about: The right schedule for you to have regular tests and exams. Things you can do on your own to prevent diseases and keep yourself healthy. What should I know about diet, weight, and exercise? Eat a healthy diet  Eat a diet that includes plenty of vegetables, fruits, low-fat dairy products, and lean protein. Do not eat a lot of foods that are high in solid fats, added sugars, or sodium.  Maintain a healthy weight Body mass index (BMI) is used to identify weight problems. It estimates body fat based on height and weight. Your health care provider can help determineyour BMI and help you achieve or maintain a healthy weight. Get regular exercise Get regular exercise. This is one of the most important things you can do for your health. Most adults should: Exercise for at least 150 minutes each week. The exercise should increase your heart rate and make you sweat (moderate-intensity exercise). Do strengthening exercises at least twice a week. This is in addition to the moderate-intensity exercise. Spend less time sitting. Even light physical activity can be beneficial. Watch cholesterol and blood lipids Have your blood tested for lipids and cholesterol at 64 years of age, then havethis test every 5 years. Have  your cholesterol levels checked more often if: Your lipid or cholesterol levels are high. You are older than 64 years of age. You are at high risk for heart disease. What should I know about cancer screening? Depending on your health history and family history, you may need to have cancer screening at various ages. This may include screening for: Breast cancer. Cervical cancer. Colorectal cancer. Skin cancer. Lung cancer. What should I know about heart disease, diabetes, and high blood pressure? Blood pressure and heart disease High blood pressure causes heart disease and increases the risk of stroke. This is more likely to develop in people who have high blood pressure readings, are of African descent, or are overweight. Have your blood pressure checked: Every 3-5 years if you are 36-58 years of age. Every year if you are 54 years old or older. Diabetes Have regular diabetes screenings. This checks your fasting blood sugar level. Have the screening done: Once every three years after age 53 if you are at a normal weight and have a low risk for diabetes. More often and at a younger age if you are overweight or have a high risk for diabetes. What should I know about preventing infection? Hepatitis B If you have a higher risk for hepatitis B, you should be screened for this virus. Talk with your health care provider to find out if you are at risk forhepatitis B infection. Hepatitis C Testing is recommended for: Everyone born from [birth year] through 1964. Anyone with known risk factors for hepatitis C. Sexually transmitted infections (STIs) Get screened for STIs, including  gonorrhea and chlamydia, if: You are sexually active and are younger than 64 years of age. You are older than 64 years of age and your health care provider tells you that you are at risk for this type of infection. Your sexual activity has changed since you were last screened, and you are at increased risk for chlamydia or  gonorrhea. Ask your health care provider if you are at risk. Ask your health care provider about whether you are at high risk for HIV. Your health care provider may recommend a prescription medicine to help prevent HIV infection. If you choose to take medicine to prevent HIV, you should first get tested for HIV. You should then be tested every 3 months for as long as you are taking the medicine. Pregnancy If you are about to stop having your period (premenopausal) and you may become pregnant, seek counseling before you get pregnant. Take 400 to 800 micrograms (mcg) of folic acid every day if you become pregnant. Ask for birth control (contraception) if you want to prevent pregnancy. Osteoporosis and menopause Osteoporosis is a disease in which the bones lose minerals and strength with aging. This can result in bone fractures. If you are 39 years old or older, or if you are at risk for osteoporosis and fractures, ask your health care provider if you should: Be screened for bone loss. Take a calcium or vitamin D supplement to lower your risk of fractures. Be given hormone replacement therapy (HRT) to treat symptoms of menopause. Follow these instructions at home: Lifestyle Do not use any products that contain nicotine or tobacco, such as cigarettes, e-cigarettes, and chewing tobacco. If you need help quitting, ask your health care provider. Do not use street drugs. Do not share needles. Ask your health care provider for help if you need support or information about quitting drugs. Alcohol use Do not drink alcohol if: Your health care provider tells you not to drink. You are pregnant, may be pregnant, or are planning to become pregnant. If you drink alcohol: Limit how much you use to 0-1 drink a day. Limit intake if you are breastfeeding. Be aware of how much alcohol is in your drink. In the [place] one drink equals one 12 oz bottle of beer (355 mL), one 5 oz glass of wine (148 mL), or one 1 oz  glass of hard liquor (44 mL). General instructions Schedule regular health, dental, and eye exams. Stay current with your vaccines. Tell your health care provider if: You often feel depressed. You have ever been abused or do not feel safe at home. Summary Adopting a healthy lifestyle and getting preventive care are important in promoting health and wellness. Follow your health care provider's instructions about healthy diet, exercising, and getting tested or screened for diseases. Follow your health care provider's instructions on monitoring your cholesterol and blood pressure. This information is not intended to replace advice given to you by your health care provider. Make sure you discuss any questions you have with your healthcare provider. Document Revised: 02/17/2018 Document Reviewed: 02/17/2018 Elsevier Patient Education  2022 [employer].

## 2020-08-19 NOTE — Progress Notes (Signed)
Subjective:    Patient ID: Selena Torres, female    DOB: [birth year], 65 y.o.   MRN: [id number]   This visit occurred during the SARS-CoV-2 public health emergency.  Safety protocols were in place, including screening questions prior to the visit, additional usage of staff PPE, and extensive cleaning of exam room while observing appropriate contact time as indicated for disinfecting solutions.    HPI She is here for a physical exam.   She has had some left lower back pain.  It has not gotten better.  Nothing makes it worse.  Has not done anything for pain- pain is mild.  No radiation.   She eats lots of fresh veges and fruit.  She does have some constipation.  She does drink a lot of water. Abdominal pain - she thinks is it related to her constipation.       Medications and allergies reviewed with patient and updated if appropriate.  Patient Active Problem List   Diagnosis Date Noted   Bronchiectasis ([place]) 09/16/2018   Hyperglycemia 08/12/2016   Migraine 07/10/2015   Hyperlipidemia 07/10/2015   Anxiety 07/10/2015    Current Outpatient Medications on File Prior to Visit  Medication Sig Dispense Refill   acetaminophen (TYLENOL) 500 MG tablet Take 500 mg by mouth as needed.     amitriptyline (ELAVIL) 100 MG tablet TAKE 1 TABLET(100 MG) BY MOUTH AT BEDTIME 90 tablet 0   aspirin EC 81 MG tablet Take 81 mg by mouth daily.     atorvastatin (LIPITOR) 10 MG tablet Take 1 tablet (10 mg total) by mouth daily. 90 tablet 3   citalopram (CELEXA) 40 MG tablet TAKE 1 TABLET(40 MG) BY MOUTH DAILY 90 tablet 3   naproxen sodium (ALEVE) 220 MG tablet Take 220 mg by mouth as needed.     SUMAtriptan (IMITREX) 25 MG tablet Take 1 tablet (25 mg total) by mouth every 2 (two) hours as needed for migraine. May repeat in 2 hours if headache persists or recurs. 10 tablet 11   vitamin B-12 (CYANOCOBALAMIN) 1000 MCG tablet Take 1,000 mcg by mouth daily.     No current facility-administered medications on  file prior to visit.    Past Medical History:  Diagnosis Date   Anemia    Anxiety    Asthma    as a child   Fibroids    uterine fibroids caused anemia   Migraine     Past Surgical History:  Procedure Laterality Date   ABDOMINAL HYSTERECTOMY  1990   APPENDECTOMY     BREAST BIOPSY Left 1995   BREAST EXCISIONAL BIOPSY Left 15+ yrs ago   benign   OOPHORECTOMY  1994   still have one ovary/ mass on right ovary and was removed     Social History   Socioeconomic History   Marital status: Married    Spouse name: Not on file   Number of children: Not on file   Years of education: Not on file   Highest education level: Not on file  Occupational History   Not on file  Tobacco Use   Smoking status: Former    Pack years: 0.00    Types: Cigarettes    Quit date: 11/20/1998    Years since quitting: 21.7   Smokeless tobacco: Never  Substance and Sexual Activity   Alcohol use: Yes    Comment: occasional   Drug use: No   Sexual activity: Not on file  Other Topics Concern   Not on  file  Social History Narrative   Exercise: walks a lot, very active, no regimented exercise   Social Determinants of Health   Financial Resource Strain: Not on file  Food Insecurity: Not on file  Transportation Needs: Not on file  Physical Activity: Not on file  Stress: Not on file  Social Connections: Not on file    Family History  Problem Relation Age of Onset   Breast cancer Mother    Dementia Mother    Alzheimer's disease Mother    Alzheimer's disease Father    HIV Brother     Review of Systems  Constitutional:  Negative for chills and fever.  Eyes:  Negative for visual disturbance.  Respiratory:  Positive for cough (allergy related). Negative for shortness of breath and wheezing.   Cardiovascular:  Positive for palpitations (occ). Negative for chest pain and leg swelling.  Gastrointestinal:  Positive for abdominal pain (realted to constipation) and constipation. Negative for blood  in stool and nausea.       Rare gerd  Genitourinary:  Negative for dysuria.  Musculoskeletal:  Positive for back pain (left lower back pain). Negative for arthralgias.  Skin:  Negative for rash.  Neurological:  Positive for light-headedness (occasionally) and headaches (migraines 1-2 in past month, then can go several months w/o one).  Psychiatric/Behavioral:  Positive for dysphoric mood (mild). The patient is nervous/anxious (mild).       Objective:   Vitals:   08/20/20 0833  BP: 116/74  Pulse: 89  Temp: 98.1 F (36.7 C)  SpO2: 99%   Filed Weights   08/20/20 0833  Weight: 113 lb (51.3 kg)   Body mass index is 19.7 kg/m.  BP Readings from Last 3 Encounters:  08/20/20 116/74  08/18/19 110/78  08/17/18 128/78    Wt Readings from Last 3 Encounters:  08/20/20 113 lb (51.3 kg)  08/18/19 113 lb 12.8 oz (51.6 kg)  08/17/18 121 lb (54.9 kg)     Physical Exam Constitutional: She appears well-developed and well-nourished. No distress.  HENT:  Head: Normocephalic and atraumatic.  Right Ear: External ear normal. Normal ear canal and TM Left Ear: External ear normal.  Normal ear canal and TM Mouth/Throat: Oropharynx is clear and moist.  Eyes: Conjunctivae and EOM are normal.  Neck: Neck supple. No tracheal deviation present. No thyromegaly present.  No carotid bruit  Cardiovascular: Normal rate, regular rhythm and normal heart sounds.   No murmur heard.  No edema. Pulmonary/Chest: Effort normal and breath sounds normal. No respiratory distress. She has no wheezes. She has no rales.  Breast: deferred   Abdominal: Soft. She exhibits no distension. There is no tenderness.  Lymphadenopathy: She has no cervical adenopathy.  Skin: Skin is warm and dry. She is not diaphoretic.  Psychiatric: She has a normal mood and affect. Her behavior is normal.        Assessment & Plan:   Physical exam: Screening blood work    ordered Immunizations  discussed shingrix, had both covid  boosters. Colonoscopy  Up to date  Mammogram  due - will schedule Gyn  due - will refer - s/p hysterectomy, has one ovary Dexa  Up to date  Eye exams  due - will schedule Exercise  very active Weight  normal Substance abuse  none      See Problem List for Assessment and Plan of chronic medical problems.

## 2020-08-20 ENCOUNTER — Other Ambulatory Visit: Payer: Self-pay

## 2020-08-20 ENCOUNTER — Ambulatory Visit (INDEPENDENT_AMBULATORY_CARE_PROVIDER_SITE_OTHER): Payer: Self-pay | Admitting: Internal Medicine

## 2020-08-20 ENCOUNTER — Encounter: Payer: Self-pay | Admitting: Internal Medicine

## 2020-08-20 VITALS — BP 116/74 | HR 89 | Temp 98.1°F | Ht 63.5 in | Wt 113.0 lb

## 2020-08-20 DIAGNOSIS — M545 Low back pain, unspecified: Secondary | ICD-10-CM

## 2020-08-20 DIAGNOSIS — J479 Bronchiectasis, uncomplicated: Secondary | ICD-10-CM

## 2020-08-20 DIAGNOSIS — G43109 Migraine with aura, not intractable, without status migrainosus: Secondary | ICD-10-CM

## 2020-08-20 DIAGNOSIS — E782 Mixed hyperlipidemia: Secondary | ICD-10-CM

## 2020-08-20 DIAGNOSIS — F419 Anxiety disorder, unspecified: Secondary | ICD-10-CM

## 2020-08-20 DIAGNOSIS — Z0000 Encounter for general adult medical examination without abnormal findings: Secondary | ICD-10-CM

## 2020-08-20 DIAGNOSIS — R739 Hyperglycemia, unspecified: Secondary | ICD-10-CM

## 2020-08-20 DIAGNOSIS — K5900 Constipation, unspecified: Secondary | ICD-10-CM | POA: Insufficient documentation

## 2020-08-20 LAB — COMPREHENSIVE METABOLIC PANEL
ALT: 20 U/L (ref 0–35)
AST: 22 U/L (ref 0–37)
Albumin: 4.4 g/dL (ref 3.5–5.2)
Alkaline Phosphatase: 60 U/L (ref 39–117)
BUN: 16 mg/dL (ref 6–23)
CO2: 29 mEq/L (ref 19–32)
Calcium: 9.4 mg/dL (ref 8.4–10.5)
Chloride: 102 mEq/L (ref 96–112)
Creatinine, Ser: 0.87 mg/dL (ref 0.40–1.20)
GFR: 70.62 mL/min (ref >60.00)
Glucose, Bld: 99 mg/dL (ref 70–99)
Potassium: 3.7 mEq/L (ref 3.5–5.1)
Sodium: 140 mEq/L (ref 135–145)
Total Bilirubin: 0.7 mg/dL (ref 0.2–1.2)
Total Protein: 7.2 g/dL (ref 6.0–8.3)

## 2020-08-20 LAB — CBC WITH DIFFERENTIAL/PLATELET
Basophils Absolute: 0.1 10*3/uL (ref 0.0–0.1)
Basophils Relative: 0.8 % (ref 0.0–3.0)
Eosinophils Absolute: 0.1 10*3/uL (ref 0.0–0.7)
Eosinophils Relative: 1.5 % (ref 0.0–5.0)
HCT: 39.6 % (ref 36.0–46.0)
Hemoglobin: 13.3 g/dL (ref 12.0–15.0)
Lymphocytes Relative: 31.9 % (ref 12.0–46.0)
Lymphs Abs: 2.8 10*3/uL (ref 0.7–4.0)
MCHC: 33.7 g/dL (ref 30.0–36.0)
MCV: 95 fl (ref 78.0–100.0)
Monocytes Absolute: 0.7 10*3/uL (ref 0.1–1.0)
Monocytes Relative: 7.8 % (ref 3.0–12.0)
Neutro Abs: 5 10*3/uL (ref 1.4–7.7)
Neutrophils Relative %: 58 % (ref 43.0–77.0)
Platelets: 266 10*3/uL (ref 150.0–400.0)
RBC: 4.17 Mil/uL (ref 3.87–5.11)
RDW: 13.1 % (ref 11.5–15.5)
WBC: 8.7 10*3/uL (ref 4.0–10.5)

## 2020-08-20 LAB — LIPID PANEL
Cholesterol: 162 mg/dL (ref 0–200)
HDL: 86.7 mg/dL (ref >39.00)
LDL Cholesterol: 63 mg/dL (ref 0–99)
NonHDL: 75.06
Total CHOL/HDL Ratio: 2
Triglycerides: 61 mg/dL (ref 0.0–149.0)
VLDL: 12.2 mg/dL (ref 0.0–40.0)

## 2020-08-20 LAB — HEMOGLOBIN A1C: Hgb A1c MFr Bld: 5.8 % (ref 4.6–6.5)

## 2020-08-20 LAB — TSH: TSH: 1.77 u[IU]/mL (ref 0.35–4.50)

## 2020-08-20 MED ORDER — AMITRIPTYLINE HCL 100 MG PO TABS: ORAL_TABLET | ORAL | 1 refills | Status: DC

## 2020-08-20 MED ORDER — SUMATRIPTAN SUCCINATE 25 MG PO TABS: 25.0000 mg | ORAL_TABLET | ORAL | 11 refills | Status: AC | PRN

## 2020-08-20 MED ORDER — ATORVASTATIN CALCIUM 10 MG PO TABS: 10.0000 mg | ORAL_TABLET | Freq: Every day | ORAL | 3 refills | Status: DC

## 2020-08-20 NOTE — Assessment & Plan Note (Signed)
Seen on CT No concerning symptoms occ cough from allergies

## 2020-08-20 NOTE — Assessment & Plan Note (Signed)
Chronic Check lipid panel  Continue atorvastatin 10 mg daily Regular exercise and healthy diet encouraged  

## 2020-08-20 NOTE — Assessment & Plan Note (Signed)
Chronic Occasional migraines only Controlled Continue amitriptyline 100 mg at bedtime Continue Imitrex 25 mg as needed

## 2020-08-20 NOTE — Assessment & Plan Note (Addendum)
Chronic Mild anxiety and depression, but overall controlled Happy with current medication Continue Celexa 40 mg daily

## 2020-08-20 NOTE — Assessment & Plan Note (Signed)
New Mild - has not needed to take anything Tenderness at left SI joint Does not feel she needs further evaluation at this time Can take tylenol, advil or try heat/ice.  Can try topical arthritis meds

## 2020-08-20 NOTE — Assessment & Plan Note (Signed)
Chronic Check a1c Low sugar / carb diet Stressed regular exercise  

## 2020-08-20 NOTE — Assessment & Plan Note (Signed)
Chronic Mild Eats plenty of veges, fruit Taking probiotics and eating activia advised increasing water intake Likes prunes - can eat 2-5 daily Can try metamucil daily Ok to take miralax prn or daily

## 2020-08-26 ENCOUNTER — Other Ambulatory Visit: Payer: Self-pay | Admitting: Internal Medicine

## 2020-12-13 ENCOUNTER — Other Ambulatory Visit: Payer: Self-pay | Admitting: Internal Medicine

## 2020-12-13 DIAGNOSIS — Z1231 Encounter for screening mammogram for malignant neoplasm of breast: Secondary | ICD-10-CM

## 2021-01-16 ENCOUNTER — Ambulatory Visit
Admission: RE | Admit: 2021-01-16 | Discharge: 2021-01-16 | Disposition: A | Discharge status: Home / Self Care | Payer: No Typology Code available for payment source | Source: Ambulatory Visit | Attending: Internal Medicine | Admitting: Internal Medicine

## 2021-01-16 ENCOUNTER — Other Ambulatory Visit: Payer: Self-pay

## 2021-01-16 DIAGNOSIS — Z1231 Encounter for screening mammogram for malignant neoplasm of breast: Secondary | ICD-10-CM

## 2021-02-19 ENCOUNTER — Other Ambulatory Visit: Payer: Self-pay | Admitting: Internal Medicine

## 2021-02-19 ENCOUNTER — Ambulatory Visit: Payer: Self-pay | Admitting: Internal Medicine

## 2021-03-11 NOTE — Progress Notes (Signed)
Subjective:    Patient ID: Selena Torres, female    DOB: 12-08-1955, 66 y.o.   MRN: [id number]  This visit occurred during the SARS-CoV-2 public health emergency.  Safety protocols were in place, including screening questions prior to the visit, additional usage of staff PPE, and extensive cleaning of exam room while observing appropriate contact time as indicated for disinfecting solutions.     HPI The patient is here for follow up of their chronic medical problems, including   Has started having hives - has not had hives for years.  She has had some increased anxiety.  She is trying to deal with it.    She has had a couple of episodes of dizziness / lightheadedness  - once it was spinning and once it was lightheadedness.    Medications and allergies reviewed with patient and updated if appropriate.  Patient Active Problem List   Diagnosis Date Noted   Urticaria 03/13/2021   Constipation 08/20/2020   Low back pain 08/20/2020   Bronchiectasis ([place]) 09/16/2018   Prediabetes 08/12/2016   Migraine 07/10/2015   Hyperlipidemia 07/10/2015   Anxiety 07/10/2015    Current Outpatient Medications on File Prior to Visit  Medication Sig Dispense Refill   acetaminophen (TYLENOL) 500 MG tablet Take 500 mg by mouth as needed.     amitriptyline (ELAVIL) 100 MG tablet TAKE 1 TABLET(100 MG) BY MOUTH AT BEDTIME 90 tablet 1   aspirin EC 81 MG tablet Take 81 mg by mouth daily.     atorvastatin (LIPITOR) 10 MG tablet Take 1 tablet (10 mg total) by mouth daily. 90 tablet 3   citalopram (CELEXA) 40 MG tablet TAKE 1 TABLET(40 MG) BY MOUTH DAILY 90 tablet 3   naproxen sodium (ALEVE) 220 MG tablet Take 220 mg by mouth as needed.     SUMAtriptan (IMITREX) 25 MG tablet Take 1 tablet (25 mg total) by mouth every 2 (two) hours as needed for migraine. May repeat in 2 hours if headache persists or recurs. 10 tablet 11   vitamin B-12 (CYANOCOBALAMIN) 1000 MCG tablet Take 1,000 mcg by mouth daily.      No current facility-administered medications on file prior to visit.    Past Medical History:  Diagnosis Date   Anemia    Anxiety    Asthma    as a child   Fibroids    uterine fibroids caused anemia   Migraine     Past Surgical History:  Procedure Laterality Date   ABDOMINAL HYSTERECTOMY  1990   APPENDECTOMY     BREAST BIOPSY Left 1995   BREAST EXCISIONAL BIOPSY Left 15+ yrs ago   benign   OOPHORECTOMY  1994   still have one ovary/ mass on right ovary and was removed     Social History   Socioeconomic History   Marital status: Married    Spouse name: Not on file   Number of children: Not on file   Years of education: Not on file   Highest education level: Not on file  Occupational History   Not on file  Tobacco Use   Smoking status: Former    Types: Cigarettes    Quit date: 11/20/1998    Years since quitting: 22.3   Smokeless tobacco: Never  Substance and Sexual Activity   Alcohol use: Yes    Comment: occasional   Drug use: No   Sexual activity: Not on file  Other Topics Concern   Not on file  Social History Narrative   Exercise: walks a lot, very active, no regimented exercise   Social Determinants of Health   Financial Resource Strain: Not on file  Food Insecurity: Not on file  Transportation Needs: Not on file  Physical Activity: Not on file  Stress: Not on file  Social Connections: Not on file    Family History  Problem Relation Age of Onset   Breast cancer Mother    Dementia Mother    Alzheimer's disease Mother    Alzheimer's disease Father    HIV Brother     Review of Systems  Constitutional:  Negative for fever.  Respiratory:  Negative for cough, shortness of breath and wheezing.   Cardiovascular:  Positive for palpitations (occ, chronic). Negative for chest pain and leg swelling.  Neurological:  Negative for light-headedness and headaches.      Objective:   Vitals:   03/13/21 0847  BP: 108/72  Pulse: 97  Temp: 98 F (36.7  C)  SpO2: 98%   BP Readings from Last 3 Encounters:  03/13/21 108/72  08/20/20 116/74  08/18/19 110/78   Wt Readings from Last 3 Encounters:  03/13/21 115 lb (52.2 kg)  08/20/20 113 lb (51.3 kg)  08/18/19 113 lb 12.8 oz (51.6 kg)   Body mass index is 20.05 kg/m.   Physical Exam    Constitutional: Appears well-developed and well-nourished. No distress.  HENT:  Head: Normocephalic and atraumatic.  Neck: Neck supple. No tracheal deviation present. No thyromegaly present.  No cervical lymphadenopathy Cardiovascular: Normal rate, regular rhythm and normal heart sounds.   No murmur heard. No carotid bruit .  No edema Pulmonary/Chest: Effort normal and breath sounds normal. No respiratory distress. No has no wheezes. No rales.  Skin: Skin is warm and dry. Not diaphoretic.  Psychiatric: Normal mood and affect. Behavior is normal.      Assessment & Plan:    See Problem List for Assessment and Plan of chronic medical problems.

## 2021-03-11 NOTE — Patient Instructions (Addendum)
° ° °  Blood work was ordered.     Medications changes include :   None    Please followup in 6 months

## 2021-03-13 ENCOUNTER — Ambulatory Visit (INDEPENDENT_AMBULATORY_CARE_PROVIDER_SITE_OTHER): Payer: Self-pay | Admitting: Internal Medicine

## 2021-03-13 ENCOUNTER — Other Ambulatory Visit: Payer: Self-pay

## 2021-03-13 ENCOUNTER — Encounter: Payer: Self-pay | Admitting: Internal Medicine

## 2021-03-13 VITALS — BP 108/72 | HR 97 | Temp 98.0°F | Ht 63.5 in | Wt 115.0 lb

## 2021-03-13 DIAGNOSIS — R7303 Prediabetes: Secondary | ICD-10-CM

## 2021-03-13 DIAGNOSIS — G43109 Migraine with aura, not intractable, without status migrainosus: Secondary | ICD-10-CM

## 2021-03-13 DIAGNOSIS — F419 Anxiety disorder, unspecified: Secondary | ICD-10-CM

## 2021-03-13 DIAGNOSIS — L509 Urticaria, unspecified: Secondary | ICD-10-CM

## 2021-03-13 DIAGNOSIS — E782 Mixed hyperlipidemia: Secondary | ICD-10-CM

## 2021-03-13 NOTE — Assessment & Plan Note (Addendum)
New Had hives years ago - ? Related to stress Started having hives again - also has increased anxiety - ? Stress induced Taking zyrtec for allergies Advised taking claritin, zyrtec or benadryl prn

## 2021-03-13 NOTE — Assessment & Plan Note (Addendum)
Chronic Has increased anxiety, but dealing with it Controlled, Stable Continue Celexa 40 mg daily

## 2021-03-13 NOTE — Assessment & Plan Note (Signed)
Chronic Occasional migraine Overall controlled Continue amitriptyline 100 mg at at bedtime and Imitrex 25 mg as needed

## 2021-03-13 NOTE — Assessment & Plan Note (Signed)
Chronic Regular exercise and healthy diet encouraged Check lipid panel  Continue atorvastatin 10 mg daily 

## 2021-03-13 NOTE — Assessment & Plan Note (Signed)
Chronic Check a1c Low sugar / carb diet Stressed regular exercise  

## 2021-03-14 ENCOUNTER — Other Ambulatory Visit (INDEPENDENT_AMBULATORY_CARE_PROVIDER_SITE_OTHER): Payer: Self-pay

## 2021-03-14 DIAGNOSIS — R7303 Prediabetes: Secondary | ICD-10-CM

## 2021-03-14 DIAGNOSIS — E782 Mixed hyperlipidemia: Secondary | ICD-10-CM

## 2021-03-14 LAB — COMPREHENSIVE METABOLIC PANEL
ALT: 19 U/L (ref 0–35)
AST: 20 U/L (ref 0–37)
Albumin: 4 g/dL (ref 3.5–5.2)
Alkaline Phosphatase: 59 U/L (ref 39–117)
BUN: 19 mg/dL (ref 6–23)
CO2: 28 mEq/L (ref 19–32)
Calcium: 9.3 mg/dL (ref 8.4–10.5)
Chloride: 104 mEq/L (ref 96–112)
Creatinine, Ser: 0.91 mg/dL (ref 0.40–1.20)
GFR: 66.65 mL/min (ref >60.00)
Glucose, Bld: 110 mg/dL — ABNORMAL HIGH (ref 70–99)
Potassium: 3.6 mEq/L (ref 3.5–5.1)
Sodium: 139 mEq/L (ref 135–145)
Total Bilirubin: 0.6 mg/dL (ref 0.2–1.2)
Total Protein: 6.5 g/dL (ref 6.0–8.3)

## 2021-03-14 LAB — HEMOGLOBIN A1C: Hgb A1c MFr Bld: 5.9 % (ref 4.6–6.5)

## 2021-03-14 LAB — LIPID PANEL
Cholesterol: 156 mg/dL (ref 0–200)
HDL: 72.3 mg/dL (ref >39.00)
LDL Cholesterol: 71 mg/dL (ref 0–99)
NonHDL: 83.33
Total CHOL/HDL Ratio: 2
Triglycerides: 60 mg/dL (ref 0.0–149.0)
VLDL: 12 mg/dL (ref 0.0–40.0)

## 2021-05-16 ENCOUNTER — Other Ambulatory Visit: Payer: Self-pay | Admitting: Internal Medicine

## 2021-07-27 ENCOUNTER — Other Ambulatory Visit: Payer: Self-pay | Admitting: Internal Medicine

## 2021-08-21 ENCOUNTER — Other Ambulatory Visit: Payer: Self-pay | Admitting: Internal Medicine

## 2021-09-24 ENCOUNTER — Encounter: Payer: Self-pay | Admitting: Internal Medicine

## 2021-09-24 NOTE — Progress Notes (Unsigned)
Subjective:    Patient ID: Selena Torres, female    DOB: 1955-08-02, 66 y.o.   MRN: [id number]     HPI Selena Torres is here for follow up of her chronic medical problems, including anxiety, hyperlipidemia, migraines, prediabetes, bronchiectasis  Was having hives at her last visit.  They are better - she occ gets them.    A little lightheadedness or dizziness at times.  She could do better with hydration.    Walking every other day.  Taking MVI.  Medications and allergies reviewed with patient and updated if appropriate.  Current Outpatient Medications on File Prior to Visit  Medication Sig Dispense Refill   acetaminophen (TYLENOL) 500 MG tablet Take 500 mg by mouth as needed.     amitriptyline (ELAVIL) 100 MG tablet TAKE 1 TABLET(100 MG) BY MOUTH AT BEDTIME 90 tablet 1   aspirin EC 81 MG tablet Take 81 mg by mouth daily.     atorvastatin (LIPITOR) 10 MG tablet TAKE 1 TABLET(10 MG) BY MOUTH DAILY 90 tablet 3   citalopram (CELEXA) 40 MG tablet TAKE 1 TABLET(40 MG) BY MOUTH DAILY 90 tablet 3   naproxen sodium (ALEVE) 220 MG tablet Take 220 mg by mouth as needed.     SUMAtriptan (IMITREX) 25 MG tablet Take 1 tablet (25 mg total) by mouth every 2 (two) hours as needed for migraine. May repeat in 2 hours if headache persists or recurs. 10 tablet 11   vitamin B-12 (CYANOCOBALAMIN) 1000 MCG tablet Take 1,000 mcg by mouth daily.     No current facility-administered medications on file prior to visit.     Review of Systems  Constitutional:  Negative for chills and fever.  Respiratory:  Negative for cough, shortness of breath and wheezing.   Cardiovascular:  Positive for palpitations. Negative for chest pain and leg swelling.  Neurological:  Positive for light-headedness (occ  dehydration, orthostasis) and headaches (are migraines).       Objective:   Vitals:   09/25/21 0828  BP: 110/70  Pulse: 67  Temp: 98 F (36.7 C)  SpO2: 99%   BP Readings from Last 3 Encounters:   09/25/21 110/70  03/13/21 108/72  08/20/20 116/74   Wt Readings from Last 3 Encounters:  09/25/21 114 lb (51.7 kg)  03/13/21 115 lb (52.2 kg)  08/20/20 113 lb (51.3 kg)   Body mass index is 19.88 kg/m.    Physical Exam Constitutional:      General: She is not in acute distress.    Appearance: Normal appearance.  HENT:     Head: Normocephalic and atraumatic.  Eyes:     Conjunctiva/sclera: Conjunctivae normal.  Cardiovascular:     Rate and Rhythm: Normal rate and regular rhythm.     Heart sounds: Normal heart sounds. No murmur heard. Pulmonary:     Effort: Pulmonary effort is normal. No respiratory distress.     Breath sounds: Normal breath sounds. No wheezing.  Musculoskeletal:     Cervical back: Neck supple.     Right lower leg: No edema.     Left lower leg: No edema.  Lymphadenopathy:     Cervical: No cervical adenopathy.  Skin:    General: Skin is warm and dry.     Findings: No rash.  Neurological:     Mental Status: She is alert. Mental status is at baseline.  Psychiatric:        Mood and Affect: Mood normal.  Behavior: Behavior normal.        Lab Results  Component Value Date   WBC 8.7 08/20/2020   HGB 13.3 08/20/2020   HCT 39.6 08/20/2020   PLT 266.0 08/20/2020   GLUCOSE 110 (H) 03/14/2021   CHOL 156 03/14/2021   TRIG 60.0 03/14/2021   HDL 72.30 03/14/2021   LDLCALC 71 03/14/2021   ALT 19 03/14/2021   AST 20 03/14/2021   NA 139 03/14/2021   K 3.6 03/14/2021   CL 104 03/14/2021   CREATININE 0.91 03/14/2021   BUN 19 03/14/2021   CO2 28 03/14/2021   TSH 1.77 08/20/2020   HGBA1C 5.9 03/14/2021     Assessment & Plan:    See Problem List for Assessment and Plan of chronic medical problems.

## 2021-09-24 NOTE — Patient Instructions (Addendum)
     Blood work was ordered.     Medications changes include :      Your prescription(s) have been sent to your pharmacy.    A referral was ordered for XX.     Someone from that office will call you to schedule an appointment.    Return in about 6 months (around 03/28/2022) for Physical Exam.

## 2021-09-25 ENCOUNTER — Ambulatory Visit (INDEPENDENT_AMBULATORY_CARE_PROVIDER_SITE_OTHER): Payer: MEDICARE | Admitting: Internal Medicine

## 2021-09-25 VITALS — BP 110/70 | HR 67 | Temp 98.0°F | Ht 63.5 in | Wt 114.0 lb

## 2021-09-25 DIAGNOSIS — F419 Anxiety disorder, unspecified: Secondary | ICD-10-CM | POA: Diagnosis not present

## 2021-09-25 DIAGNOSIS — J479 Bronchiectasis, uncomplicated: Secondary | ICD-10-CM

## 2021-09-25 DIAGNOSIS — R7303 Prediabetes: Secondary | ICD-10-CM

## 2021-09-25 DIAGNOSIS — G43109 Migraine with aura, not intractable, without status migrainosus: Secondary | ICD-10-CM | POA: Diagnosis not present

## 2021-09-25 DIAGNOSIS — E782 Mixed hyperlipidemia: Secondary | ICD-10-CM

## 2021-09-25 LAB — COMPREHENSIVE METABOLIC PANEL
ALT: 25 U/L (ref 0–35)
AST: 26 U/L (ref 0–37)
Albumin: 4.6 g/dL (ref 3.5–5.2)
Alkaline Phosphatase: 56 U/L (ref 39–117)
BUN: 16 mg/dL (ref 6–23)
CO2: 29 mEq/L (ref 19–32)
Calcium: 9.7 mg/dL (ref 8.4–10.5)
Chloride: 101 mEq/L (ref 96–112)
Creatinine, Ser: 0.89 mg/dL (ref 0.40–1.20)
GFR: 68.2 mL/min (ref >60.00)
Glucose, Bld: 103 mg/dL — ABNORMAL HIGH (ref 70–99)
Potassium: 4 mEq/L (ref 3.5–5.1)
Sodium: 139 mEq/L (ref 135–145)
Total Bilirubin: 0.6 mg/dL (ref 0.2–1.2)
Total Protein: 7.1 g/dL (ref 6.0–8.3)

## 2021-09-25 LAB — HEMOGLOBIN A1C: Hgb A1c MFr Bld: 5.8 % (ref 4.6–6.5)

## 2021-09-25 NOTE — Assessment & Plan Note (Signed)
Chronic Controlled, Stable Continue Celexa 40 mg daily

## 2021-09-25 NOTE — Assessment & Plan Note (Signed)
Chronic Regular exercise and healthy diet encouraged Check lipid panel  Continue atorvastatin 10 mg daily 

## 2021-09-25 NOTE — Assessment & Plan Note (Addendum)
Chronic Seen on CT - improved on repeat imaging Denies any coughing, wheezing, shortness of breath

## 2021-09-25 NOTE — Assessment & Plan Note (Signed)
Chronic Check a1c Low sugar / carb diet Stressed regular exercise  

## 2021-09-25 NOTE — Assessment & Plan Note (Signed)
Chronic Occasional migraines Controlled Continue amitriptyline 100 mg at bedtime and Imitrex 25 mg as needed

## 2021-11-15 ENCOUNTER — Other Ambulatory Visit: Payer: Self-pay | Admitting: Internal Medicine

## 2021-12-30 IMAGING — MG MM DIGITAL SCREENING BILAT W/ TOMO AND CAD
8 series · 9 of 24 positions shown · non-contrast
Comparison: Previous exam(s).

CLINICAL DATA: Screening.

EXAM:
DIGITAL SCREENING BILATERAL MAMMOGRAM WITH TOMOSYNTHESIS AND CAD
TECHNIQUE: Bilateral screening digital craniocaudal and mediolateral oblique
mammograms were obtained. Bilateral screening digital breast
tomosynthesis was performed. The images were evaluated with
computer-aided detection.

[L CC synth-2D]
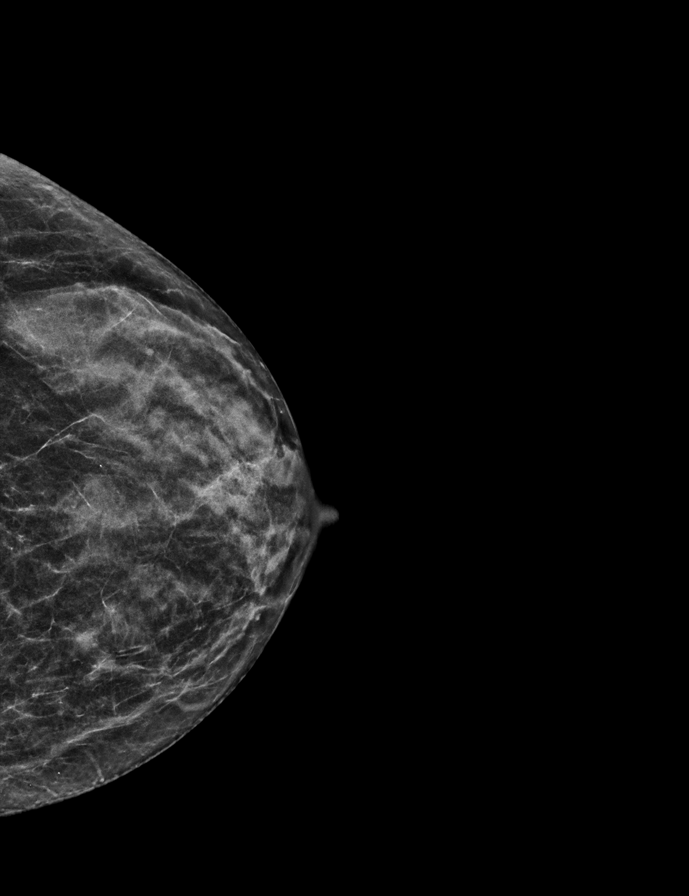

[L MLO synth-2D]
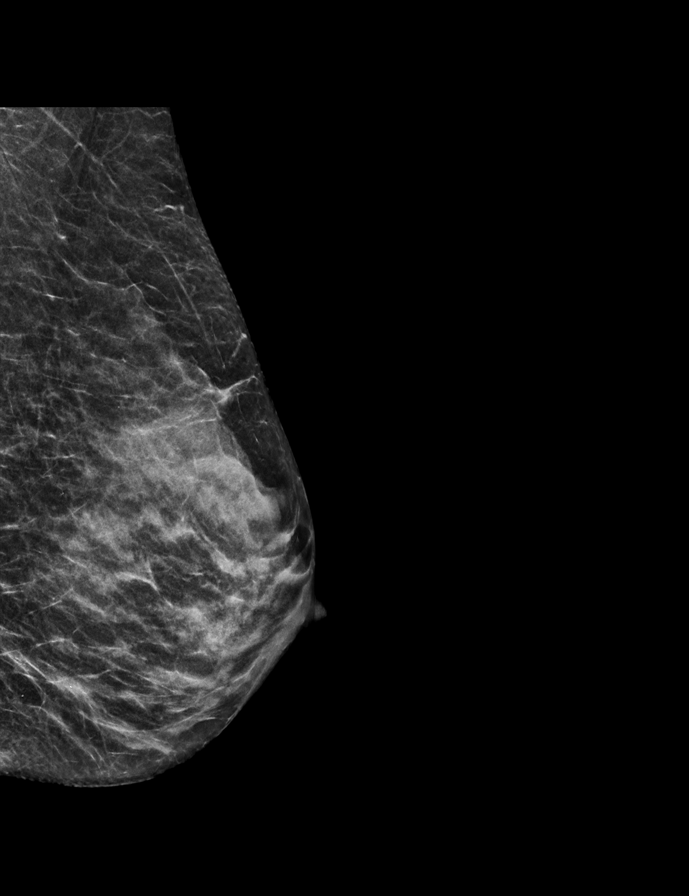

[R CC synth-2D]
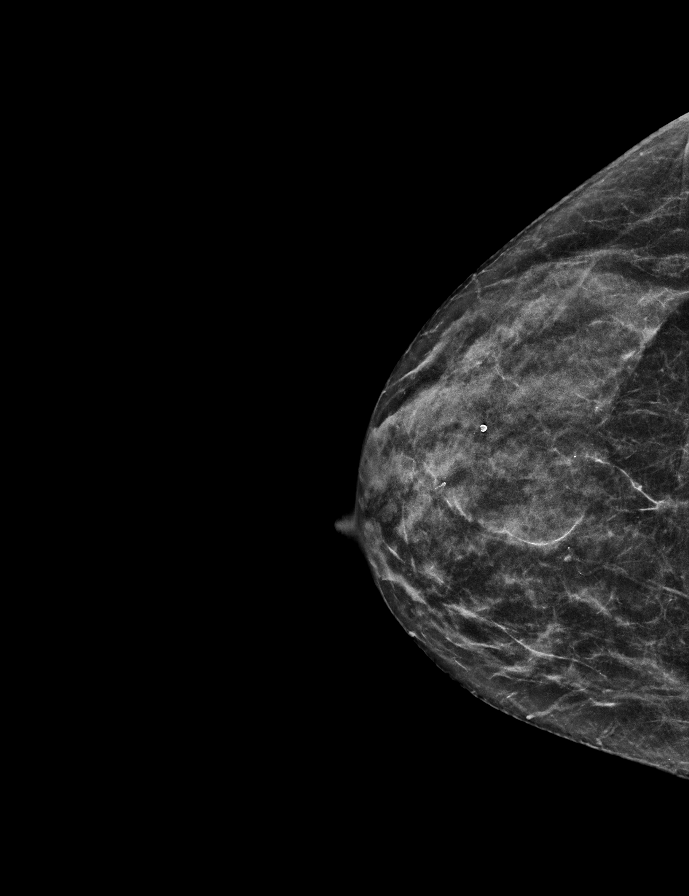

[R MLO synth-2D]
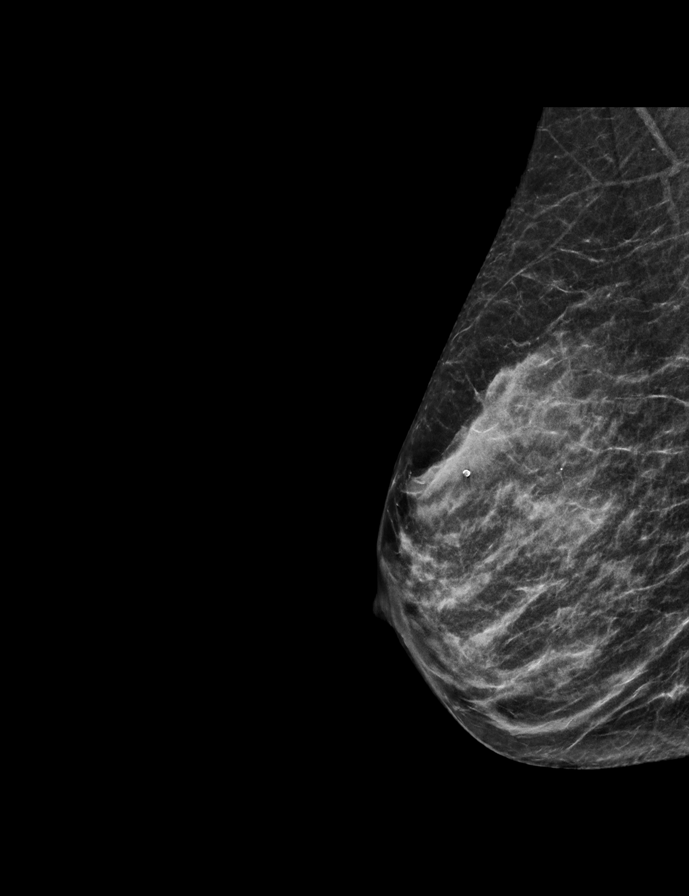

[R CC tomo · 2 of 48 frames shown]
[frame 16/48]
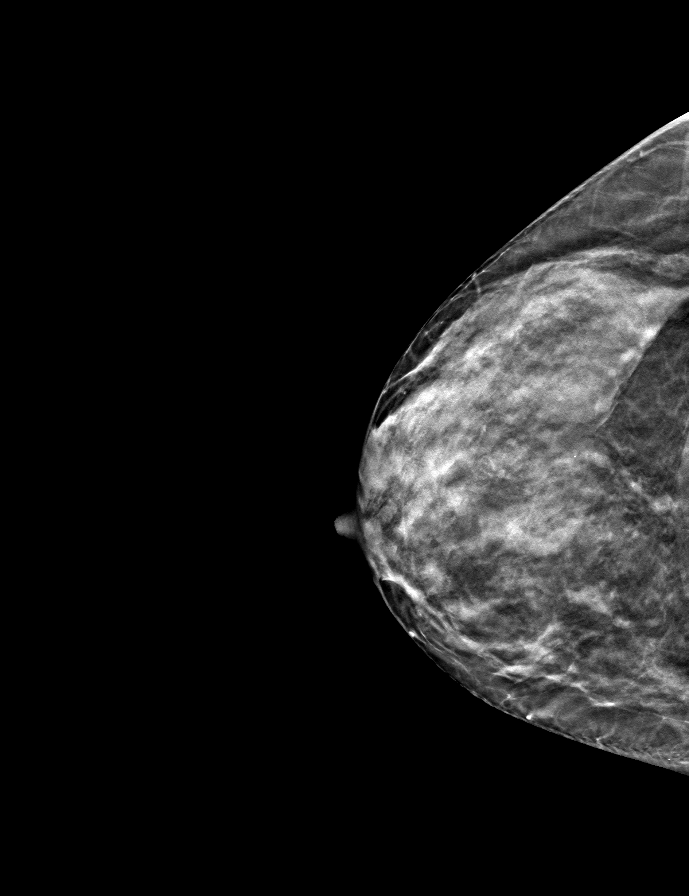
[frame 25/48]
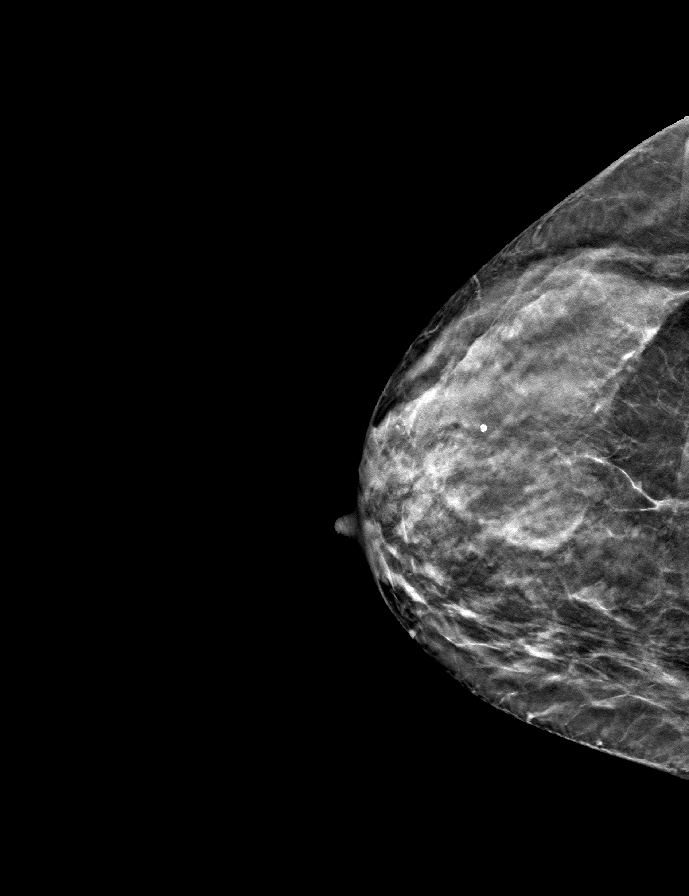

[R MLO tomo · tomo slice 23/45.0]
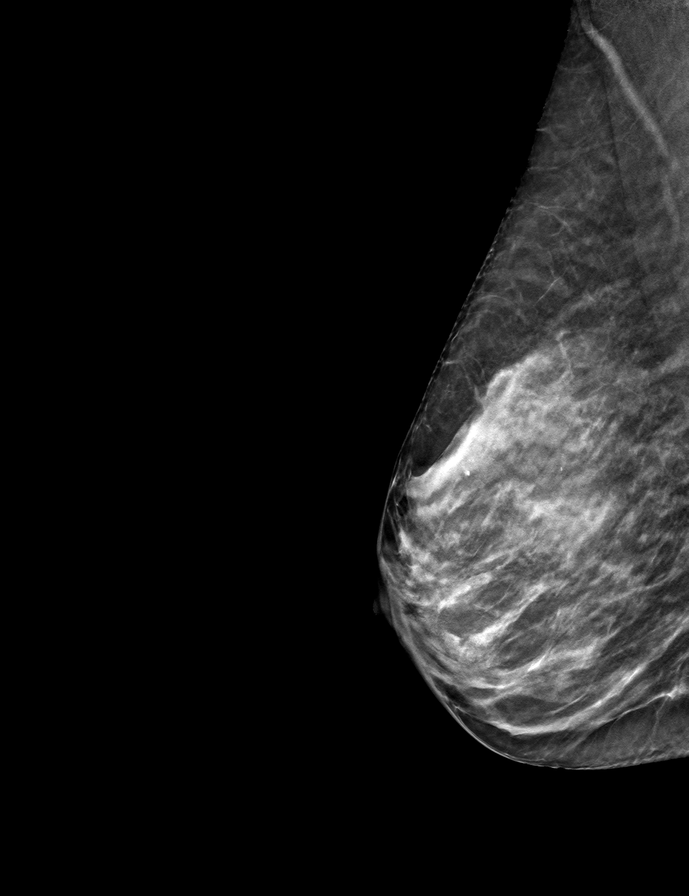

[L CC tomo · tomo slice 23/46.0]
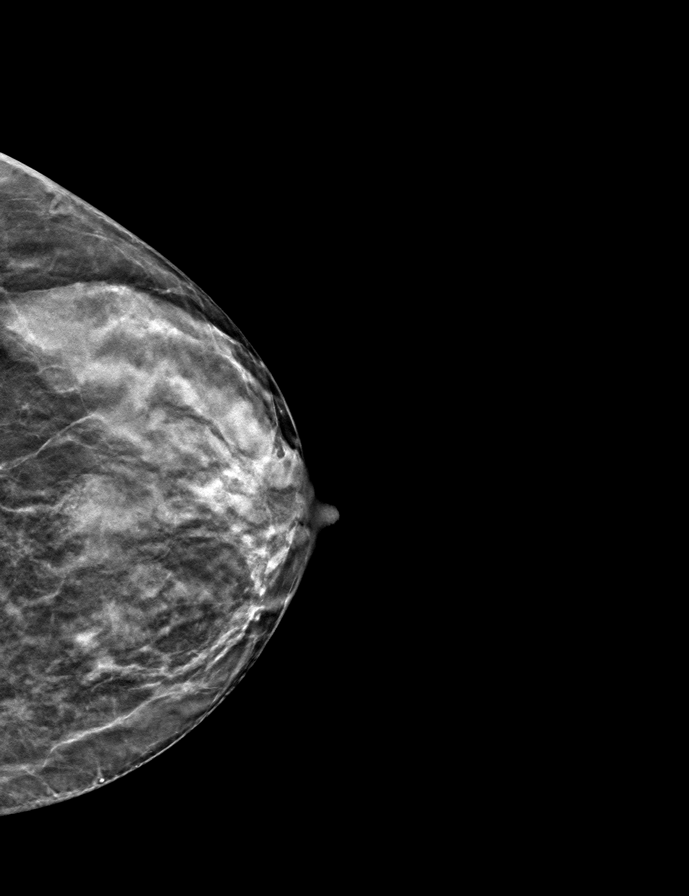

[L MLO tomo · tomo slice 24/47.0]
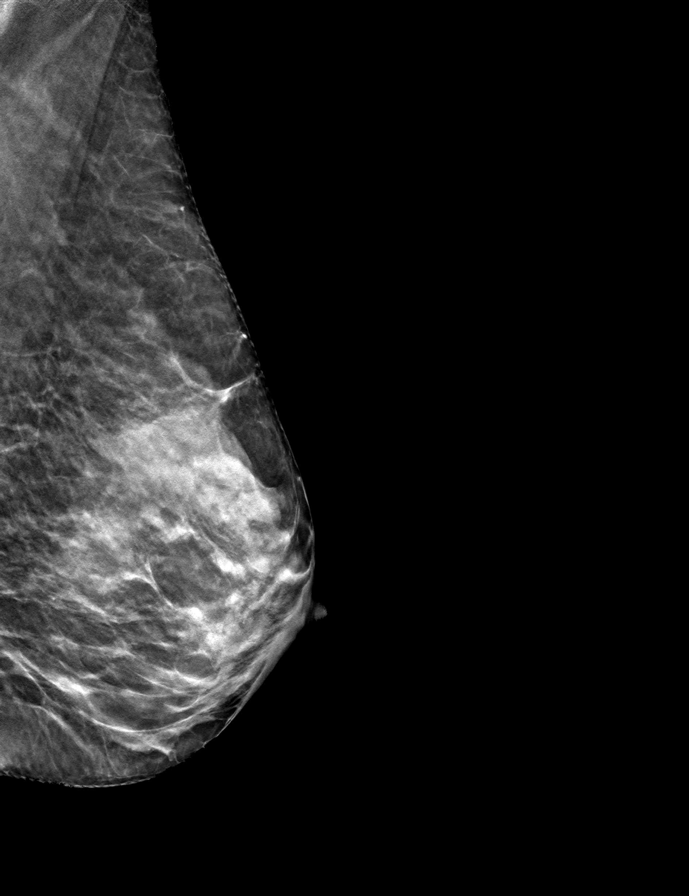

[9 of 24 positions shown; findings below may reference images not displayed]

ACR Breast Density Category d: The breast tissue is extremely dense,
which lowers the sensitivity of mammography
FINDINGS: There are no findings suspicious for malignancy.
IMPRESSION: No mammographic evidence of malignancy. A result letter of this
screening mammogram will be mailed directly to the patient.

RECOMMENDATION:
Screening mammogram in one year. (Code:[id number])

BI-RADS CATEGORY  1: Negative.

## 2022-04-01 ENCOUNTER — Encounter: Payer: Self-pay | Admitting: Internal Medicine

## 2022-04-01 NOTE — Patient Instructions (Addendum)
Prevnar 20 vaccine given   Blood work was ordered.   The lab is on the first floor.    Medications changes include :   steroid cream for rash     Return in about 6 months (around 10/01/2022) for follow up.   Health Maintenance, Female Adopting a healthy lifestyle and getting preventive care are important in promoting health and wellness. Ask your health care provider about: The right schedule for you to have regular tests and exams. Things you can do on your own to prevent diseases and keep yourself healthy. What should I know about diet, weight, and exercise? Eat a healthy diet  Eat a diet that includes plenty of vegetables, fruits, low-fat dairy products, and lean protein. Do not eat a lot of foods that are high in solid fats, added sugars, or sodium. Maintain a healthy weight Body mass index (BMI) is used to identify weight problems. It estimates body fat based on height and weight. Your health care provider can help determine your BMI and help you achieve or maintain a healthy weight. Get regular exercise Get regular exercise. This is one of the most important things you can do for your health. Most adults should: Exercise for at least 150 minutes each week. The exercise should increase your heart rate and make you sweat (moderate-intensity exercise). Do strengthening exercises at least twice a week. This is in addition to the moderate-intensity exercise. Spend less time sitting. Even light physical activity can be beneficial. Watch cholesterol and blood lipids Have your blood tested for lipids and cholesterol at 66 years of age, then have this test every 5 years. Have your cholesterol levels checked more often if: Your lipid or cholesterol levels are high. You are older than 66 years of age. You are at high risk for heart disease. What should I know about cancer screening? Depending on your health history and family history, you may need to have cancer screening at  various ages. This may include screening for: Breast cancer. Cervical cancer. Colorectal cancer. Skin cancer. Lung cancer. What should I know about heart disease, diabetes, and high blood pressure? Blood pressure and heart disease High blood pressure causes heart disease and increases the risk of stroke. This is more likely to develop in people who have high blood pressure readings or are overweight. Have your blood pressure checked: Every 3-5 years if you are 46-64 years of age. Every year if you are 22 years old or older. Diabetes Have regular diabetes screenings. This checks your fasting blood sugar level. Have the screening done: Once every three years after age 47 if you are at a normal weight and have a low risk for diabetes. More often and at a younger age if you are overweight or have a high risk for diabetes. What should I know about preventing infection? Hepatitis B If you have a higher risk for hepatitis B, you should be screened for this virus. Talk with your health care provider to find out if you are at risk for hepatitis B infection. Hepatitis C Testing is recommended for: Everyone born from [birth year] through 1964. Anyone with known risk factors for hepatitis C. Sexually transmitted infections (STIs) Get screened for STIs, including gonorrhea and chlamydia, if: You are sexually active and are younger than 66 years of age. You are older than 66 years of age and your health care provider tells you that you are at risk for this type of infection. Your sexual activity has changed since you were  last screened, and you are at increased risk for chlamydia or gonorrhea. Ask your health care provider if you are at risk. Ask your health care provider about whether you are at high risk for HIV. Your health care provider may recommend a prescription medicine to help prevent HIV infection. If you choose to take medicine to prevent HIV, you should first get tested for HIV. You should then be  tested every 3 months for as long as you are taking the medicine. Pregnancy If you are about to stop having your period (premenopausal) and you may become pregnant, seek counseling before you get pregnant. Take 400 to 800 micrograms (mcg) of folic acid every day if you become pregnant. Ask for birth control (contraception) if you want to prevent pregnancy. Osteoporosis and menopause Osteoporosis is a disease in which the bones lose minerals and strength with aging. This can result in bone fractures. If you are 76 years old or older, or if you are at risk for osteoporosis and fractures, ask your health care provider if you should: Be screened for bone loss. Take a calcium or vitamin D supplement to lower your risk of fractures. Be given hormone replacement therapy (HRT) to treat symptoms of menopause. Follow these instructions at home: Alcohol use Do not drink alcohol if: Your health care provider tells you not to drink. You are pregnant, may be pregnant, or are planning to become pregnant. If you drink alcohol: Limit how much you have to: 0-1 drink a day. Know how much alcohol is in your drink. In the [place] one drink equals one 12 oz bottle of beer (355 mL), one 5 oz glass of wine (148 mL), or one 1 oz glass of hard liquor (44 mL). Lifestyle Do not use any products that contain nicotine or tobacco. These products include cigarettes, chewing tobacco, and vaping devices, such as e-cigarettes. If you need help quitting, ask your health care provider. Do not use street drugs. Do not share needles. Ask your health care provider for help if you need support or information about quitting drugs. General instructions Schedule regular health, dental, and eye exams. Stay current with your vaccines. Tell your health care provider if: You often feel depressed. You have ever been abused or do not feel safe at home. Summary Adopting a healthy lifestyle and getting preventive care are important in  promoting health and wellness. Follow your health care provider's instructions about healthy diet, exercising, and getting tested or screened for diseases. Follow your health care provider's instructions on monitoring your cholesterol and blood pressure. This information is not intended to replace advice given to you by your health care provider. Make sure you discuss any questions you have with your health care provider. Document Revised: 07/16/2020 Document Reviewed: 07/16/2020 Elsevier Patient Education  [employer].

## 2022-04-01 NOTE — Progress Notes (Unsigned)
Subjective:    Patient ID: Selena Torres, female    DOB: 04-04-1955, 67 y.o.   MRN: [id number]      HPI Jamica is here for a Physical exam.    Left sided back pain - lower back to buttock region.  Takes aleve and that helps.  It has been going on for a while.  It is there most of the time.  It is sore when she gets up from sitting.    Medications and allergies reviewed with patient and updated if appropriate.  Current Outpatient Medications on File Prior to Visit  Medication Sig Dispense Refill   acetaminophen (TYLENOL) 500 MG tablet Take 500 mg by mouth as needed.     amitriptyline (ELAVIL) 100 MG tablet TAKE 1 TABLET(100 MG) BY MOUTH AT BEDTIME 90 tablet 1   aspirin EC 81 MG tablet Take 81 mg by mouth daily.     atorvastatin (LIPITOR) 10 MG tablet TAKE 1 TABLET(10 MG) BY MOUTH DAILY 90 tablet 3   citalopram (CELEXA) 40 MG tablet TAKE 1 TABLET(40 MG) BY MOUTH DAILY 90 tablet 3   naproxen sodium (ALEVE) 220 MG tablet Take 220 mg by mouth as needed.     SUMAtriptan (IMITREX) 25 MG tablet Take 1 tablet (25 mg total) by mouth every 2 (two) hours as needed for migraine. May repeat in 2 hours if headache persists or recurs. 10 tablet 11   vitamin B-12 (CYANOCOBALAMIN) 1000 MCG tablet Take 1,000 mcg by mouth daily.     No current facility-administered medications on file prior to visit.    Review of Systems  Constitutional:  Negative for fever.  Eyes:  Negative for visual disturbance.  Respiratory:  Negative for cough, shortness of breath and wheezing.   Cardiovascular:  Positive for palpitations. Negative for chest pain and leg swelling.  Gastrointestinal:  Positive for constipation (taking miralax, fiber gummies). Negative for abdominal pain, blood in stool, diarrhea and nausea.       No gerd  Genitourinary:  Negative for dysuria and hematuria.  Musculoskeletal:  Positive for back pain and neck pain (occasional). Negative for arthralgias.  Skin:  Positive for rash (patches of  dry skin - hand, face, posterior neck).  Neurological:  Positive for light-headedness (occ - orthostatic) and headaches (migraines, stress headaches).  Psychiatric/Behavioral:  Negative for dysphoric mood. The patient is nervous/anxious.         Objective:   Vitals:   04/02/22 0825  BP: 112/72  Pulse: 100  Temp: 97.9 F (36.6 C)  SpO2: 97%   Filed Weights   04/02/22 0825  Weight: 113 lb (51.3 kg)   Body mass index is 19.7 kg/m.  BP Readings from Last 3 Encounters:  04/02/22 112/72  09/25/21 110/70  03/13/21 108/72    Wt Readings from Last 3 Encounters:  04/02/22 113 lb (51.3 kg)  09/25/21 114 lb (51.7 kg)  03/13/21 115 lb (52.2 kg)       Physical Exam Constitutional: She appears well-developed and well-nourished. No distress.  HENT:  Head: Normocephalic and atraumatic.  Right Ear: External ear normal. Normal ear canal and TM Left Ear: External ear normal.  Normal ear canal and TM Mouth/Throat: Oropharynx is clear and moist.  Eyes: Conjunctivae normal.  Neck: Neck supple. No tracheal deviation present. No thyromegaly present.  No carotid bruit  Cardiovascular: Normal rate, regular rhythm and normal heart sounds.   No murmur heard.  No edema. Pulmonary/Chest: Effort normal and breath sounds normal. No respiratory  distress. She has no wheezes. She has no rales.  Breast: deferred   Abdominal: Soft. She exhibits no distension. There is no tenderness.  Lymphadenopathy: She has no cervical adenopathy.  MSk: not lumbar spine pain or pain with palpation in lower back Skin: Skin is warm and dry. She is not diaphoretic.  Psychiatric: She has a normal mood and affect. Her behavior is normal.     Lab Results  Component Value Date   WBC 8.7 08/20/2020   HGB 13.3 08/20/2020   HCT 39.6 08/20/2020   PLT 266.0 08/20/2020   GLUCOSE 103 (H) 09/25/2021   CHOL 156 03/14/2021   TRIG 60.0 03/14/2021   HDL 72.30 03/14/2021   LDLCALC 71 03/14/2021   ALT 25 09/25/2021    AST 26 09/25/2021   NA 139 09/25/2021   K 4.0 09/25/2021   CL 101 09/25/2021   CREATININE 0.89 09/25/2021   BUN 16 09/25/2021   CO2 29 09/25/2021   TSH 1.77 08/20/2020   HGBA1C 5.8 09/25/2021         Assessment & Plan:   Physical exam: Screening blood work  ordered Exercise  walking Weight  normal Substance abuse  none   Reviewed recommended immunizations.  Prevnar 20 given today   Health Maintenance  Topic Date Due   Medicare Annual Wellness (AWV)  Never done   Zoster Vaccines- Shingrix (1 of 2) Never done   Pneumonia Vaccine 45+ Years old (1 - PCV) Never done   DEXA SCAN  12/09/2021   COVID-19 Vaccine (3 - 2023-24 season) 04/18/2022 (Originally 11/08/2021)   MAMMOGRAM  01/17/2023   DTaP/Tdap/Td (2 - Td or Tdap) 08/13/2026   COLONOSCOPY (Pts 45-16yr Insurance coverage will need to be confirmed)  12/04/2027   INFLUENZA VACCINE  Completed   Hepatitis C Screening  Completed   HIV Screening  Completed   HPV VACCINES  Aged Out          See Problem List for Assessment and Plan of chronic medical problems.

## 2022-04-02 ENCOUNTER — Ambulatory Visit (INDEPENDENT_AMBULATORY_CARE_PROVIDER_SITE_OTHER): Payer: MEDICARE | Admitting: Internal Medicine

## 2022-04-02 ENCOUNTER — Other Ambulatory Visit: Payer: Self-pay

## 2022-04-02 VITALS — BP 112/72 | HR 100 | Temp 97.9°F | Ht 63.5 in | Wt 113.0 lb

## 2022-04-02 DIAGNOSIS — G43109 Migraine with aura, not intractable, without status migrainosus: Secondary | ICD-10-CM

## 2022-04-02 DIAGNOSIS — R7303 Prediabetes: Secondary | ICD-10-CM

## 2022-04-02 DIAGNOSIS — E782 Mixed hyperlipidemia: Secondary | ICD-10-CM

## 2022-04-02 DIAGNOSIS — Z23 Encounter for immunization: Secondary | ICD-10-CM

## 2022-04-02 DIAGNOSIS — Z13820 Encounter for screening for osteoporosis: Secondary | ICD-10-CM

## 2022-04-02 DIAGNOSIS — K5900 Constipation, unspecified: Secondary | ICD-10-CM

## 2022-04-02 DIAGNOSIS — M545 Low back pain, unspecified: Secondary | ICD-10-CM

## 2022-04-02 DIAGNOSIS — F419 Anxiety disorder, unspecified: Secondary | ICD-10-CM | POA: Diagnosis not present

## 2022-04-02 DIAGNOSIS — D414 Neoplasm of uncertain behavior of bladder: Secondary | ICD-10-CM | POA: Insufficient documentation

## 2022-04-02 DIAGNOSIS — Z0000 Encounter for general adult medical examination without abnormal findings: Secondary | ICD-10-CM

## 2022-04-02 DIAGNOSIS — E2839 Other primary ovarian failure: Secondary | ICD-10-CM

## 2022-04-02 LAB — COMPREHENSIVE METABOLIC PANEL
ALT: 27 U/L (ref 0–35)
AST: 26 U/L (ref 0–37)
Albumin: 4.4 g/dL (ref 3.5–5.2)
Alkaline Phosphatase: 51 U/L (ref 39–117)
BUN: 16 mg/dL (ref 6–23)
CO2: 29 mEq/L (ref 19–32)
Calcium: 9.7 mg/dL (ref 8.4–10.5)
Chloride: 102 mEq/L (ref 96–112)
Creatinine, Ser: 0.78 mg/dL (ref 0.40–1.20)
GFR: 79.6 mL/min (ref >60.00)
Glucose, Bld: 98 mg/dL (ref 70–99)
Potassium: 4.3 mEq/L (ref 3.5–5.1)
Sodium: 140 mEq/L (ref 135–145)
Total Bilirubin: 0.6 mg/dL (ref 0.2–1.2)
Total Protein: 6.9 g/dL (ref 6.0–8.3)

## 2022-04-02 LAB — CBC WITH DIFFERENTIAL/PLATELET
Basophils Absolute: 0.1 10*3/uL (ref 0.0–0.1)
Basophils Relative: 0.7 % (ref 0.0–3.0)
Eosinophils Absolute: 0.2 10*3/uL (ref 0.0–0.7)
Eosinophils Relative: 2.5 % (ref 0.0–5.0)
HCT: 37.9 % (ref 36.0–46.0)
Hemoglobin: 12.8 g/dL (ref 12.0–15.0)
Lymphocytes Relative: 27.1 % (ref 12.0–46.0)
Lymphs Abs: 2.4 10*3/uL (ref 0.7–4.0)
MCHC: 33.7 g/dL (ref 30.0–36.0)
MCV: 97.2 fl (ref 78.0–100.0)
Monocytes Absolute: 0.6 10*3/uL (ref 0.1–1.0)
Monocytes Relative: 6.3 % (ref 3.0–12.0)
Neutro Abs: 5.7 10*3/uL (ref 1.4–7.7)
Neutrophils Relative %: 63.4 % (ref 43.0–77.0)
Platelets: 282 10*3/uL (ref 150.0–400.0)
RBC: 3.9 Mil/uL (ref 3.87–5.11)
RDW: 13.1 % (ref 11.5–15.5)
WBC: 9 10*3/uL (ref 4.0–10.5)

## 2022-04-02 LAB — LIPID PANEL
Cholesterol: 172 mg/dL (ref 0–200)
HDL: 83.4 mg/dL (ref >39.00)
LDL Cholesterol: 73 mg/dL (ref 0–99)
NonHDL: 89.01
Total CHOL/HDL Ratio: 2
Triglycerides: 82 mg/dL (ref 0.0–149.0)
VLDL: 16.4 mg/dL (ref 0.0–40.0)

## 2022-04-02 LAB — TSH: TSH: 1.92 u[IU]/mL (ref 0.35–5.50)

## 2022-04-02 LAB — HEMOGLOBIN A1C: Hgb A1c MFr Bld: 5.9 % (ref 4.6–6.5)

## 2022-04-02 MED ORDER — TRIAMCINOLONE ACETONIDE 0.1 % EX CREA: 1.0000 | TOPICAL_CREAM | Freq: Two times a day (BID) | CUTANEOUS | 2 refills | Status: AC | PRN

## 2022-04-02 NOTE — Assessment & Plan Note (Signed)
Chronic Controlled Taking miralax, fiber gummies, drinking lots of water

## 2022-04-02 NOTE — Assessment & Plan Note (Signed)
Chronic Check lipid panel, cmp Continue atorvastatin 10 mg daily Regular exercise and healthy diet encouraged

## 2022-04-02 NOTE — Addendum Note (Signed)
Addended by: Marcina Millard on: 04/02/2022 04:02 PM   Modules accepted: Orders

## 2022-04-02 NOTE — Assessment & Plan Note (Signed)
Chronic Check a1c Low sugar / carb diet Stressed regular exercise   

## 2022-04-02 NOTE — Assessment & Plan Note (Signed)
Chronic Occasional migraines Controlled Continue amitriptyline 100 mg HS and imitrex 25 mg prn

## 2022-04-02 NOTE — Assessment & Plan Note (Addendum)
Chronic Controlled, stable - still has some situational anxiety Continue celexa 40 mg daily

## 2022-04-02 NOTE — Assessment & Plan Note (Signed)
H/o bladder polyps years ago No issues since then Has a little feeling that reminds her of the polyps Denies dysuria, hematuria

## 2022-04-02 NOTE — Assessment & Plan Note (Addendum)
Chronic Mild Left lower back  No tenderness today on exam Takes 1 aleve as needed and it helps If pain worsens advised can refer to sports medicine for further evaluation

## 2022-05-10 ENCOUNTER — Other Ambulatory Visit: Payer: Self-pay | Admitting: Internal Medicine

## 2022-07-29 ENCOUNTER — Other Ambulatory Visit: Payer: Self-pay | Admitting: Internal Medicine

## 2022-08-18 ENCOUNTER — Other Ambulatory Visit: Payer: Self-pay | Admitting: Internal Medicine

## 2022-09-15 ENCOUNTER — Encounter: Payer: Self-pay | Admitting: Internal Medicine

## 2022-09-15 NOTE — Patient Instructions (Addendum)
      Blood work was ordered.   The lab is on the first floor.    Medications changes include :  none       Return in about 6 months (around 03/19/2023) for Physical Exam.

## 2022-09-15 NOTE — Progress Notes (Signed)
Subjective:    Patient ID: Selena Torres, female    DOB: 1956/02/19, 67 y.o.   MRN: [id number]     HPI Selena Torres is here for follow up of her chronic medical problems.  Doing well.  Has had some anxiety recently.    Some bladder pressure,  urine darker at times.   Drinks a lot of water.    Medications and allergies reviewed with patient and updated if appropriate.  Current Outpatient Medications on File Prior to Visit  Medication Sig Dispense Refill   acetaminophen (TYLENOL) 500 MG tablet Take 500 mg by mouth as needed.     amitriptyline (ELAVIL) 100 MG tablet TAKE 1 TABLET(100 MG) BY MOUTH AT BEDTIME 90 tablet 1   aspirin EC 81 MG tablet Take 81 mg by mouth daily.     atorvastatin (LIPITOR) 10 MG tablet TAKE 1 TABLET(10 MG) BY MOUTH DAILY 90 tablet 3   citalopram (CELEXA) 40 MG tablet TAKE 1 TABLET(40 MG) BY MOUTH DAILY 90 tablet 3   naproxen sodium (ALEVE) 220 MG tablet Take 220 mg by mouth as needed.     SUMAtriptan (IMITREX) 25 MG tablet Take 1 tablet (25 mg total) by mouth every 2 (two) hours as needed for migraine. May repeat in 2 hours if headache persists or recurs. 10 tablet 11   triamcinolone cream (KENALOG) 0.1 % Apply 1 Application topically 2 (two) times daily as needed. Do not use for more than 14 days at a time 30 g 2   vitamin B-12 (CYANOCOBALAMIN) 1000 MCG tablet Take 1,000 mcg by mouth daily.     No current facility-administered medications on file prior to visit.     Review of Systems  Constitutional:  Negative for fever.  Respiratory:  Negative for cough, shortness of breath and wheezing.   Cardiovascular:  Positive for palpitations (occ). Negative for chest pain and leg swelling.  Neurological:  Positive for light-headedness (occ) and headaches.       Objective:   Vitals:   09/16/22 0932  BP: 104/70  Pulse: 80  Temp: 98 F (36.7 C)  SpO2: 99%   BP Readings from Last 3 Encounters:  09/16/22 104/70  04/02/22 112/72  09/25/21 110/70    Wt Readings from Last 3 Encounters:  09/16/22 111 lb (50.3 kg)  04/02/22 113 lb (51.3 kg)  09/25/21 114 lb (51.7 kg)   Body mass index is 19.35 kg/m.    Physical Exam Constitutional:      General: She is not in acute distress.    Appearance: Normal appearance.  HENT:     Head: Normocephalic and atraumatic.  Eyes:     Conjunctiva/sclera: Conjunctivae normal.  Cardiovascular:     Rate and Rhythm: Normal rate and regular rhythm.     Heart sounds: Normal heart sounds.  Pulmonary:     Effort: Pulmonary effort is normal. No respiratory distress.     Breath sounds: Normal breath sounds. No wheezing.  Musculoskeletal:     Cervical back: Neck supple.     Right lower leg: No edema.     Left lower leg: No edema.  Lymphadenopathy:     Cervical: No cervical adenopathy.  Skin:    General: Skin is warm and dry.     Findings: No rash.  Neurological:     Mental Status: She is alert. Mental status is at baseline.  Psychiatric:        Mood and Affect: Mood normal.  Behavior: Behavior normal.        Lab Results  Component Value Date   WBC 9.0 04/02/2022   HGB 12.8 04/02/2022   HCT 37.9 04/02/2022   PLT 282.0 04/02/2022   GLUCOSE 98 04/02/2022   CHOL 172 04/02/2022   TRIG 82.0 04/02/2022   HDL 83.40 04/02/2022   LDLCALC 73 04/02/2022   ALT 27 04/02/2022   AST 26 04/02/2022   NA 140 04/02/2022   K 4.3 04/02/2022   CL 102 04/02/2022   CREATININE 0.78 04/02/2022   BUN 16 04/02/2022   CO2 29 04/02/2022   TSH 1.92 04/02/2022   HGBA1C 5.9 04/02/2022     Assessment & Plan:    See Problem List for Assessment and Plan of chronic medical problems.

## 2022-09-16 ENCOUNTER — Ambulatory Visit (INDEPENDENT_AMBULATORY_CARE_PROVIDER_SITE_OTHER): Payer: MEDICARE | Admitting: Internal Medicine

## 2022-09-16 VITALS — BP 104/70 | HR 80 | Temp 98.0°F | Ht 63.5 in | Wt 111.0 lb

## 2022-09-16 DIAGNOSIS — F419 Anxiety disorder, unspecified: Secondary | ICD-10-CM

## 2022-09-16 DIAGNOSIS — R82998 Other abnormal findings in urine: Secondary | ICD-10-CM | POA: Diagnosis not present

## 2022-09-16 DIAGNOSIS — G43109 Migraine with aura, not intractable, without status migrainosus: Secondary | ICD-10-CM

## 2022-09-16 DIAGNOSIS — E782 Mixed hyperlipidemia: Secondary | ICD-10-CM | POA: Diagnosis not present

## 2022-09-16 DIAGNOSIS — R7303 Prediabetes: Secondary | ICD-10-CM

## 2022-09-16 LAB — COMPREHENSIVE METABOLIC PANEL
ALT: 29 U/L (ref 0–35)
AST: 27 U/L (ref 0–37)
Albumin: 4.3 g/dL (ref 3.5–5.2)
Alkaline Phosphatase: 54 U/L (ref 39–117)
BUN: 13 mg/dL (ref 6–23)
CO2: 31 mEq/L (ref 19–32)
Calcium: 10 mg/dL (ref 8.4–10.5)
Chloride: 102 mEq/L (ref 96–112)
Creatinine, Ser: 0.94 mg/dL (ref 0.40–1.20)
GFR: 63.43 mL/min (ref >60.00)
Glucose, Bld: 93 mg/dL (ref 70–99)
Potassium: 4.1 mEq/L (ref 3.5–5.1)
Sodium: 139 mEq/L (ref 135–145)
Total Bilirubin: 0.6 mg/dL (ref 0.2–1.2)
Total Protein: 6.8 g/dL (ref 6.0–8.3)

## 2022-09-16 LAB — URINALYSIS, ROUTINE W REFLEX MICROSCOPIC
Bilirubin Urine: NEGATIVE
Hgb urine dipstick: NEGATIVE
Ketones, ur: NEGATIVE
Nitrite: NEGATIVE
Specific Gravity, Urine: 1.01 (ref 1.000–1.030)
Total Protein, Urine: NEGATIVE
Urine Glucose: NEGATIVE
Urobilinogen, UA: 0.2 (ref 0.0–1.0)
pH: 6.5 (ref 5.0–8.0)

## 2022-09-16 LAB — HEMOGLOBIN A1C: Hgb A1c MFr Bld: 5.8 % (ref 4.6–6.5)

## 2022-09-16 NOTE — Assessment & Plan Note (Signed)
New Has had some intermittent dark urine and some bladder pressure H/o bladder polyps Check UA, Ucx

## 2022-09-16 NOTE — Assessment & Plan Note (Addendum)
Chronic Increased anxiety recently but she is doing ok with it Controlled, stable - still has some situational anxiety Continue celexa 40 mg daily, also on amitriptyline 100 mg at bedtime

## 2022-09-16 NOTE — Assessment & Plan Note (Signed)
Chronic Continue atorvastatin 10 mg daily Regular exercise and healthy diet encouraged  

## 2022-09-16 NOTE — Assessment & Plan Note (Signed)
Chronic Check a1c, CMP Low sugar / carb diet Stressed regular exercise   

## 2022-09-16 NOTE — Assessment & Plan Note (Signed)
Chronic Occasional migraines Controlled Continue amitriptyline 100 mg HS and imitrex 25 mg prn 

## 2022-09-17 LAB — URINE CULTURE: Result:: NO GROWTH

## 2022-10-01 ENCOUNTER — Ambulatory Visit: Payer: MEDICARE | Admitting: Internal Medicine

## 2022-10-22 ENCOUNTER — Other Ambulatory Visit: Payer: MEDICARE

## 2022-11-04 ENCOUNTER — Other Ambulatory Visit: Payer: Self-pay | Admitting: Internal Medicine

## 2022-11-07 ENCOUNTER — Other Ambulatory Visit: Payer: Self-pay | Admitting: Internal Medicine

## 2023-03-31 ENCOUNTER — Encounter: Payer: MEDICARE | Admitting: Internal Medicine

## 2023-05-01 ENCOUNTER — Encounter: Payer: MEDICARE | Admitting: Internal Medicine
